# Patient Record
Sex: Male | Born: 1965 | Race: Black or African American | Hispanic: No | Marital: Single | Smoking: Never smoker
Health system: Southern US, Community
[De-identification: ages and names within clinical notes are randomized; demographics above are authoritative.]

## PROBLEM LIST (undated history)

## (undated) DIAGNOSIS — I10 Essential (primary) hypertension: Secondary | ICD-10-CM

## (undated) DIAGNOSIS — K759 Inflammatory liver disease, unspecified: Secondary | ICD-10-CM

## (undated) DIAGNOSIS — K219 Gastro-esophageal reflux disease without esophagitis: Secondary | ICD-10-CM

## (undated) DIAGNOSIS — T148XXA Other injury of unspecified body region, initial encounter: Secondary | ICD-10-CM

## (undated) DIAGNOSIS — M1990 Unspecified osteoarthritis, unspecified site: Secondary | ICD-10-CM

## (undated) DIAGNOSIS — G629 Polyneuropathy, unspecified: Secondary | ICD-10-CM

## (undated) DIAGNOSIS — F329 Major depressive disorder, single episode, unspecified: Secondary | ICD-10-CM

## (undated) DIAGNOSIS — F419 Anxiety disorder, unspecified: Secondary | ICD-10-CM

## (undated) DIAGNOSIS — F32A Depression, unspecified: Secondary | ICD-10-CM

## (undated) DIAGNOSIS — E785 Hyperlipidemia, unspecified: Secondary | ICD-10-CM

## (undated) HISTORY — DX: Other injury of unspecified body region, initial encounter: T14.8XXA

## (undated) HISTORY — PX: SHOULDER ARTHROSCOPY: SHX128

## (undated) HISTORY — PX: ABDOMINAL SURGERY: SHX537

## (undated) HISTORY — DX: Hyperlipidemia, unspecified: E78.5

## (undated) HISTORY — PX: TOE REPLANTATION: SHX1072

## (undated) HISTORY — PX: JOINT REPLACEMENT: SHX530

## (undated) HISTORY — DX: Essential (primary) hypertension: I10

## (undated) HISTORY — PX: APPENDECTOMY: SHX54

---

## 1997-10-01 ENCOUNTER — Emergency Department (HOSPITAL_COMMUNITY): Admission: EM | Admit: 1997-10-01 | Discharge: 1997-10-01 | Payer: Self-pay | Admitting: Emergency Medicine

## 2005-09-14 ENCOUNTER — Emergency Department (HOSPITAL_COMMUNITY): Admission: EM | Admit: 2005-09-14 | Discharge: 2005-09-14 | Payer: Self-pay | Admitting: Emergency Medicine

## 2006-05-02 ENCOUNTER — Emergency Department (HOSPITAL_COMMUNITY): Admission: EM | Admit: 2006-05-02 | Discharge: 2006-05-02 | Payer: Self-pay | Admitting: Emergency Medicine

## 2008-07-24 ENCOUNTER — Inpatient Hospital Stay: Payer: Self-pay | Admitting: Psychiatry

## 2011-07-24 DIAGNOSIS — T148XXA Other injury of unspecified body region, initial encounter: Secondary | ICD-10-CM

## 2011-07-24 HISTORY — DX: Other injury of unspecified body region, initial encounter: T14.8XXA

## 2011-08-02 ENCOUNTER — Emergency Department (HOSPITAL_COMMUNITY): Payer: Medicare Other

## 2011-08-02 ENCOUNTER — Observation Stay (HOSPITAL_COMMUNITY)
Admission: EM | Admit: 2011-08-02 | Discharge: 2011-08-03 | Disposition: A | Discharge status: Home / Self Care | Payer: Medicare Other | Source: Ambulatory Visit | Attending: General Surgery | Admitting: General Surgery

## 2011-08-02 DIAGNOSIS — S21219A Laceration without foreign body of unspecified back wall of thorax without penetration into thoracic cavity, initial encounter: Secondary | ICD-10-CM | POA: Diagnosis present

## 2011-08-02 DIAGNOSIS — Y998 Other external cause status: Secondary | ICD-10-CM | POA: Insufficient documentation

## 2011-08-02 DIAGNOSIS — S20219A Contusion of unspecified front wall of thorax, initial encounter: Secondary | ICD-10-CM

## 2011-08-02 DIAGNOSIS — E785 Hyperlipidemia, unspecified: Secondary | ICD-10-CM | POA: Insufficient documentation

## 2011-08-02 DIAGNOSIS — D62 Acute posthemorrhagic anemia: Secondary | ICD-10-CM | POA: Insufficient documentation

## 2011-08-02 DIAGNOSIS — I10 Essential (primary) hypertension: Secondary | ICD-10-CM | POA: Insufficient documentation

## 2011-08-02 DIAGNOSIS — S21119A Laceration without foreign body of unspecified front wall of thorax without penetration into thoracic cavity, initial encounter: Secondary | ICD-10-CM

## 2011-08-02 DIAGNOSIS — S41009A Unspecified open wound of unspecified shoulder, initial encounter: Principal | ICD-10-CM | POA: Insufficient documentation

## 2011-08-02 DIAGNOSIS — E119 Type 2 diabetes mellitus without complications: Secondary | ICD-10-CM | POA: Insufficient documentation

## 2011-08-02 LAB — POCT I-STAT, CHEM 8
Chloride: 111 mEq/L (ref 96–112)
Creatinine, Ser: 1.5 mg/dL — ABNORMAL HIGH (ref 0.50–1.35)
Glucose, Bld: 94 mg/dL (ref 70–99)
Potassium: 3.6 mEq/L (ref 3.5–5.1)

## 2011-08-02 MED ORDER — HYDROMORPHONE HCL PF 1 MG/ML IJ SOLN
1.0000 mg | INTRAMUSCULAR | Status: DC | PRN
  Administered 2011-08-02: 2 mg via INTRAVENOUS
  Filled 2011-08-02: qty 2

## 2011-08-02 MED ORDER — DOCUSATE SODIUM 100 MG PO CAPS
100.0000 mg | ORAL_CAPSULE | Freq: Two times a day (BID) | ORAL | Status: DC
  Administered 2011-08-02: 100 mg via ORAL
  Filled 2011-08-02: qty 1

## 2011-08-02 MED ORDER — IOHEXOL 350 MG/ML SOLN
100.0000 mL | Freq: Once | INTRAVENOUS | Status: AC | PRN
  Administered 2011-08-02: 100 mL via INTRAVENOUS

## 2011-08-02 MED ORDER — HYDROMORPHONE HCL PF 1 MG/ML IJ SOLN
1.0000 mg | Freq: Once | INTRAMUSCULAR | Status: AC
  Administered 2011-08-02: 1 mg via INTRAVENOUS

## 2011-08-02 MED ORDER — TETANUS-DIPHTH-ACELL PERTUSSIS 5-2.5-18.5 LF-MCG/0.5 IM SUSP
0.5000 mL | Freq: Once | INTRAMUSCULAR | Status: AC
  Administered 2011-08-02: 0.5 mL via INTRAMUSCULAR
  Filled 2011-08-02: qty 0.5

## 2011-08-02 MED ORDER — HYDROCODONE-ACETAMINOPHEN 5-325 MG PO TABS
2.0000 | ORAL_TABLET | ORAL | Status: DC | PRN
  Administered 2011-08-02 – 2011-08-03 (×2): 2 via ORAL
  Filled 2011-08-02 (×2): qty 2

## 2011-08-02 MED ORDER — CEFAZOLIN SODIUM-DEXTROSE 2-3 GM-% IV SOLR
2.0000 g | Freq: Once | INTRAVENOUS | Status: AC
  Administered 2011-08-02: 2 g via INTRAVENOUS

## 2011-08-02 MED ORDER — FENTANYL CITRATE 0.05 MG/ML IJ SOLN: 50.0000 ug | Freq: Once | INTRAMUSCULAR | Status: DC

## 2011-08-02 MED ORDER — CEFAZOLIN SODIUM-DEXTROSE 2-3 GM-% IV SOLR
INTRAVENOUS | Status: AC
  Filled 2011-08-02: qty 50

## 2011-08-02 MED ORDER — HYDROMORPHONE HCL PF 2 MG/ML IJ SOLN
INTRAMUSCULAR | Status: AC
  Administered 2011-08-02: 1 mg
  Filled 2011-08-02: qty 1

## 2011-08-02 MED ORDER — PANTOPRAZOLE SODIUM 40 MG IV SOLR
40.0000 mg | Freq: Every day | INTRAVENOUS | Status: DC
  Filled 2011-08-02: qty 40

## 2011-08-02 MED ORDER — ONDANSETRON HCL 4 MG/2ML IJ SOLN: 4.0000 mg | Freq: Four times a day (QID) | INTRAMUSCULAR | Status: DC | PRN

## 2011-08-02 MED ORDER — CEFAZOLIN SODIUM 1-5 GM-% IV SOLN
1.0000 g | Freq: Three times a day (TID) | INTRAVENOUS | Status: DC
  Administered 2011-08-03: 1 g via INTRAVENOUS
  Filled 2011-08-02 (×3): qty 50

## 2011-08-02 MED ORDER — ONDANSETRON HCL 4 MG PO TABS: 4.0000 mg | ORAL_TABLET | Freq: Four times a day (QID) | ORAL | Status: DC | PRN

## 2011-08-02 MED ORDER — KCL IN DEXTROSE-NACL 20-5-0.45 MEQ/L-%-% IV SOLN
INTRAVENOUS | Status: DC
  Administered 2011-08-02: 23:00:00 via INTRAVENOUS
  Filled 2011-08-02 (×4): qty 1000

## 2011-08-02 MED ORDER — FENTANYL CITRATE 0.05 MG/ML IJ SOLN
INTRAMUSCULAR | Status: AC
  Administered 2011-08-02: 50 ug
  Filled 2011-08-02: qty 2

## 2011-08-02 MED ORDER — MIDAZOLAM HCL 2 MG/2ML IJ SOLN
INTRAMUSCULAR | Status: AC
  Filled 2011-08-02: qty 2

## 2011-08-02 MED ORDER — PANTOPRAZOLE SODIUM 40 MG PO TBEC
40.0000 mg | DELAYED_RELEASE_TABLET | Freq: Every day | ORAL | Status: DC
  Administered 2011-08-02: 40 mg via ORAL
  Filled 2011-08-02: qty 1

## 2011-08-02 MED ORDER — HYDROMORPHONE HCL PF 1 MG/ML IJ SOLN
INTRAMUSCULAR | Status: AC
  Filled 2011-08-02: qty 1

## 2011-08-02 MED ORDER — MIDAZOLAM HCL 2 MG/2ML IJ SOLN
2.0000 mg | Freq: Once | INTRAMUSCULAR | Status: AC
  Administered 2011-08-02: 2 mg via INTRAVENOUS
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
## 2011-08-02 NOTE — ED Notes (Signed)
Dr. Lindie Spruce at bedside suturing pt.
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
## 2011-08-02 NOTE — ED Notes (Signed)
Pts Password: [phone]
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
## 2011-08-02 NOTE — ED Notes (Signed)
Staples placed by Dr.Wyatt. Dressing dry/intact. [employer] at bedside speaking to pt.
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
## 2011-08-02 NOTE — ED Notes (Signed)
To CT with RN and MD at bedside.

## 2011-08-02 NOTE — ED Notes (Signed)
Lab test results handed to Lindie Spruce, MD (Trauma)

## 2011-08-03 ENCOUNTER — Observation Stay (HOSPITAL_COMMUNITY): Payer: Medicare Other

## 2011-08-03 DIAGNOSIS — S21219A Laceration without foreign body of unspecified back wall of thorax without penetration into thoracic cavity, initial encounter: Secondary | ICD-10-CM | POA: Diagnosis present

## 2011-08-03 DIAGNOSIS — D62 Acute posthemorrhagic anemia: Secondary | ICD-10-CM | POA: Diagnosis not present

## 2011-08-03 DIAGNOSIS — I10 Essential (primary) hypertension: Secondary | ICD-10-CM | POA: Insufficient documentation

## 2011-08-03 DIAGNOSIS — E119 Type 2 diabetes mellitus without complications: Secondary | ICD-10-CM | POA: Insufficient documentation

## 2011-08-03 DIAGNOSIS — E785 Hyperlipidemia, unspecified: Secondary | ICD-10-CM | POA: Insufficient documentation

## 2011-08-03 DIAGNOSIS — S20219A Contusion of unspecified front wall of thorax, initial encounter: Secondary | ICD-10-CM | POA: Diagnosis present

## 2011-08-03 LAB — BASIC METABOLIC PANEL
CO2: 21 mEq/L (ref 19–32)
GFR calc non Af Amer: 82 mL/min — ABNORMAL LOW (ref >90)
Glucose, Bld: 108 mg/dL — ABNORMAL HIGH (ref 70–99)
Potassium: 3.7 mEq/L (ref 3.5–5.1)
Sodium: 141 mEq/L (ref 135–145)

## 2011-08-03 LAB — CBC
HCT: 36.4 % — ABNORMAL LOW (ref 39.0–52.0)
Hemoglobin: 12.7 g/dL — ABNORMAL LOW (ref 13.0–17.0)
Hemoglobin: 12.6 g/dL — ABNORMAL LOW (ref 13.0–17.0)
MCH: 30.1 pg (ref 26.0–34.0)
RBC: 4.19 MIL/uL — ABNORMAL LOW (ref 4.22–5.81)
RDW: 15.7 % — ABNORMAL HIGH (ref 11.5–15.5)
WBC: 11.7 10*3/uL — ABNORMAL HIGH (ref 4.0–10.5)

## 2011-08-03 LAB — GLUCOSE, CAPILLARY: Glucose-Capillary: 99 mg/dL (ref 70–99)

## 2011-08-03 LAB — PROTIME-INR: Prothrombin Time: 14.3 seconds (ref 11.6–15.2)

## 2011-08-03 MED ORDER — HYDROCODONE-ACETAMINOPHEN 5-325 MG PO TABS: 2.0000 | ORAL_TABLET | ORAL | Status: AC | PRN

## 2011-08-03 NOTE — Progress Notes (Signed)
Pt to be discharged home per MD order. VSS. No signs of bleeding or infection at wound site. Discharge instructions given and pt verbalized understand and signed the forms. Copy of discharge instructions given and script for pain meds. IV's removed.

## 2011-08-03 NOTE — Progress Notes (Signed)
UR complete 
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
## 2011-08-03 NOTE — Progress Notes (Signed)
Agree Eain Mullendore, MD, MPH, FACS Pager: [phone]  
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
## 2011-08-03 NOTE — Discharge Summary (Signed)
Kevin Zufall, MD, MPH, FACS Pager: [phone]  

## 2011-08-17 ENCOUNTER — Ambulatory Visit (INDEPENDENT_AMBULATORY_CARE_PROVIDER_SITE_OTHER): Payer: Medicare Other | Admitting: Internal Medicine

## 2011-08-17 ENCOUNTER — Encounter (INDEPENDENT_AMBULATORY_CARE_PROVIDER_SITE_OTHER): Payer: Self-pay

## 2011-08-17 VITALS — BP 170/116 | HR 72 | Temp 98.6°F | Resp 18 | Ht 70.0 in | Wt 227.4 lb

## 2011-08-17 DIAGNOSIS — S21209A Unspecified open wound of unspecified back wall of thorax without penetration into thoracic cavity, initial encounter: Secondary | ICD-10-CM

## 2011-08-17 DIAGNOSIS — S21219A Laceration without foreign body of unspecified back wall of thorax without penetration into thoracic cavity, initial encounter: Secondary | ICD-10-CM

## 2011-08-17 NOTE — Patient Instructions (Signed)
Call with questions or concerns  Follow up as needed
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
## 2012-04-26 ENCOUNTER — Emergency Department (HOSPITAL_COMMUNITY): Payer: PRIVATE HEALTH INSURANCE

## 2012-04-26 ENCOUNTER — Encounter (HOSPITAL_COMMUNITY): Payer: Self-pay

## 2012-04-26 ENCOUNTER — Emergency Department (HOSPITAL_COMMUNITY)
Admission: EM | Admit: 2012-04-26 | Discharge: 2012-04-26 | Disposition: A | Discharge status: Home / Self Care | Payer: PRIVATE HEALTH INSURANCE | Attending: Emergency Medicine | Admitting: Emergency Medicine

## 2012-04-26 DIAGNOSIS — E785 Hyperlipidemia, unspecified: Secondary | ICD-10-CM | POA: Insufficient documentation

## 2012-04-26 DIAGNOSIS — Z79899 Other long term (current) drug therapy: Secondary | ICD-10-CM | POA: Insufficient documentation

## 2012-04-26 DIAGNOSIS — I10 Essential (primary) hypertension: Secondary | ICD-10-CM | POA: Insufficient documentation

## 2012-04-26 DIAGNOSIS — E119 Type 2 diabetes mellitus without complications: Secondary | ICD-10-CM | POA: Insufficient documentation

## 2012-04-26 DIAGNOSIS — M549 Dorsalgia, unspecified: Secondary | ICD-10-CM

## 2012-04-26 DIAGNOSIS — M545 Low back pain, unspecified: Secondary | ICD-10-CM | POA: Insufficient documentation

## 2012-04-26 DIAGNOSIS — Z87828 Personal history of other (healed) physical injury and trauma: Secondary | ICD-10-CM | POA: Insufficient documentation

## 2012-04-26 LAB — URINALYSIS, ROUTINE W REFLEX MICROSCOPIC
Bilirubin Urine: NEGATIVE
Ketones, ur: NEGATIVE mg/dL
Nitrite: NEGATIVE
Protein, ur: NEGATIVE mg/dL
Specific Gravity, Urine: 1.019 (ref 1.005–1.030)
Urobilinogen, UA: 1 mg/dL (ref 0.0–1.0)

## 2012-04-26 MED ORDER — PREDNISONE 20 MG PO TABS
ORAL_TABLET | ORAL | Status: DC
Start: 2012-04-26 — End: 2012-09-19

## 2012-04-26 NOTE — ED Notes (Signed)
Pt c/o left flank pain for a week now. States he had gone to the MD and he had gotten pain pills but they are not helping. States nothing is helping. States his urine is dark in color but does not hurt when he urinates.

## 2012-04-26 NOTE — ED Notes (Signed)
Pt presents with left flank and lower back pain.  Lower back tender to palpate.  Pt denies any difficulty urinating.  Last BM was today.
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
## 2012-04-27 NOTE — ED Provider Notes (Signed)
Medical screening examination/treatment/procedure(s) were performed by non-physician practitioner and as supervising physician I was immediately available for consultation/collaboration.  Flint Melter, MD 04/27/12 [phone]

## 2012-09-19 ENCOUNTER — Encounter (HOSPITAL_COMMUNITY): Payer: Self-pay | Admitting: *Deleted

## 2012-09-19 ENCOUNTER — Emergency Department (HOSPITAL_COMMUNITY)
Admission: EM | Admit: 2012-09-19 | Discharge: 2012-09-19 | Disposition: A | Discharge status: Home / Self Care | Payer: 59 | Attending: Emergency Medicine | Admitting: Emergency Medicine

## 2012-09-19 DIAGNOSIS — Z79899 Other long term (current) drug therapy: Secondary | ICD-10-CM | POA: Insufficient documentation

## 2012-09-19 DIAGNOSIS — I10 Essential (primary) hypertension: Secondary | ICD-10-CM | POA: Insufficient documentation

## 2012-09-19 DIAGNOSIS — G8929 Other chronic pain: Secondary | ICD-10-CM | POA: Insufficient documentation

## 2012-09-19 DIAGNOSIS — E785 Hyperlipidemia, unspecified: Secondary | ICD-10-CM | POA: Insufficient documentation

## 2012-09-19 DIAGNOSIS — Z87828 Personal history of other (healed) physical injury and trauma: Secondary | ICD-10-CM | POA: Insufficient documentation

## 2012-09-19 DIAGNOSIS — M25569 Pain in unspecified knee: Secondary | ICD-10-CM | POA: Insufficient documentation

## 2012-09-19 DIAGNOSIS — E119 Type 2 diabetes mellitus without complications: Secondary | ICD-10-CM | POA: Insufficient documentation

## 2012-09-19 DIAGNOSIS — Z791 Long term (current) use of non-steroidal anti-inflammatories (NSAID): Secondary | ICD-10-CM | POA: Insufficient documentation

## 2012-09-19 MED ORDER — MELOXICAM 7.5 MG PO TABS: 7.5000 mg | ORAL_TABLET | Freq: Every day | ORAL | Status: DC

## 2012-09-19 NOTE — ED Notes (Signed)
Reports chronic left knee pain, is aware that he needs knee surgery. No pain meds at home. Pt ambulatory at triage.
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
## 2012-09-19 NOTE — Progress Notes (Signed)
Orthopedic Tech Progress Note Patient Details:  Kevin Jennings. 27-Jul-1965 [id number]  Ortho Devices Type of Ortho Device: Knee Sleeve Ortho Device/Splint Interventions: Application   Cammer, Mickie Bail 09/19/2012, 11:10 AM

## 2012-09-23 NOTE — ED Provider Notes (Signed)
Medical screening examination/treatment/procedure(s) were performed by non-physician practitioner and as supervising physician I was immediately available for consultation/collaboration.  Kathrynn Backstrom T Osa Fogarty, MD 09/23/12 0824 

## 2013-02-26 ENCOUNTER — Other Ambulatory Visit (HOSPITAL_COMMUNITY): Payer: Self-pay

## 2013-02-26 ENCOUNTER — Other Ambulatory Visit (HOSPITAL_COMMUNITY): Payer: Self-pay | Admitting: Orthopaedic Surgery

## 2013-03-04 ENCOUNTER — Encounter (HOSPITAL_COMMUNITY): Payer: Self-pay | Admitting: Pharmacy Technician

## 2013-03-06 ENCOUNTER — Other Ambulatory Visit (HOSPITAL_COMMUNITY): Payer: Self-pay | Admitting: Orthopaedic Surgery

## 2013-03-06 NOTE — Patient Instructions (Signed)
      Your procedure is scheduled on:  03/12/13  [facility]  [facility] at    0630   AM.  Call this number if you have problems the morning of surgery: [phone]        Do not eat food  Or drink :After Midnight.TUESDAY NIGHT   Take these medicines the morning of surgery with A SIP OF WATER: Amlodipine,        Flonase nasal spray DO NOT TAKE ANY DIABETES MEDICATION MORNING OF SURGERY  .  Contacts, dentures or partial plates, or metal hairpins  can not be worn to surgery. Your family will be responsible for glasses, dentures, hearing aides while you are in surgery  Leave suitcase in the car. After surgery it may be brought to your room.  For patients admitted to the hospital, checkout time is 11:00 AM day of  discharge.                DO NOT WEAR JEWELRY, LOTIONS, POWDERS, OR PERFUMES.  WOMEN-- DO NOT SHAVE LEGS OR UNDERARMS FOR 48 HOURS BEFORE SHOWERS. MEN MAY SHAVE FACE.  Patients discharged the day of surgery will not be allowed to drive home. IF going home the day of surgery, you must have a driver and someone to stay with you for the first 24 hours  Name and phone number of your driver:      admission                                                                  Please read over the following fact sheets that you were given: MRSA Information, Blood Transfusion Sheet  Information                     FAILURE TO FOLLOW THESE INSTRUCTIONS MAY RESULT IN  CANCELLATION   OF YOUR SURGERY                                                  Patient Signature _____________________________

## 2013-03-07 ENCOUNTER — Ambulatory Visit (HOSPITAL_COMMUNITY)
Admission: RE | Admit: 2013-03-07 | Discharge: 2013-03-07 | Disposition: A | Discharge status: Home / Self Care | Payer: PRIVATE HEALTH INSURANCE | Source: Ambulatory Visit | Attending: Orthopaedic Surgery | Admitting: Orthopaedic Surgery

## 2013-03-07 ENCOUNTER — Encounter (HOSPITAL_COMMUNITY): Payer: Self-pay

## 2013-03-07 ENCOUNTER — Encounter (HOSPITAL_COMMUNITY)
Admission: RE | Admit: 2013-03-07 | Discharge: 2013-03-07 | Disposition: A | Discharge status: Home / Self Care | Payer: PRIVATE HEALTH INSURANCE | Source: Ambulatory Visit | Attending: Orthopaedic Surgery | Admitting: Orthopaedic Surgery

## 2013-03-07 DIAGNOSIS — Z01812 Encounter for preprocedural laboratory examination: Secondary | ICD-10-CM | POA: Diagnosis present

## 2013-03-07 DIAGNOSIS — Z01818 Encounter for other preprocedural examination: Secondary | ICD-10-CM | POA: Insufficient documentation

## 2013-03-07 HISTORY — DX: Unspecified osteoarthritis, unspecified site: M19.90

## 2013-03-07 HISTORY — DX: Polyneuropathy, unspecified: G62.9

## 2013-03-07 LAB — URINALYSIS, ROUTINE W REFLEX MICROSCOPIC
Bilirubin Urine: NEGATIVE
Glucose, UA: NEGATIVE mg/dL
Hgb urine dipstick: NEGATIVE
KETONES UR: NEGATIVE mg/dL
LEUKOCYTES UA: NEGATIVE
NITRITE: NEGATIVE
PH: 6.5 (ref 5.0–8.0)
Protein, ur: NEGATIVE mg/dL
Specific Gravity, Urine: 1.015 (ref 1.005–1.030)
Urobilinogen, UA: 0.2 mg/dL (ref 0.0–1.0)

## 2013-03-07 LAB — CBC WITH DIFFERENTIAL/PLATELET
Basophils Absolute: 0 10*3/uL (ref 0.0–0.1)
Basophils Relative: 0 % (ref 0–1)
EOS PCT: 1 % (ref 0–5)
Eosinophils Absolute: 0.1 10*3/uL (ref 0.0–0.7)
HCT: 43 % (ref 39.0–52.0)
Hemoglobin: 15 g/dL (ref 13.0–17.0)
LYMPHS PCT: 28 % (ref 12–46)
Lymphs Abs: 2.2 10*3/uL (ref 0.7–4.0)
MCH: 30.7 pg (ref 26.0–34.0)
MCHC: 34.9 g/dL (ref 30.0–36.0)
MCV: 87.9 fL (ref 78.0–100.0)
MONO ABS: 0.8 10*3/uL (ref 0.1–1.0)
Monocytes Relative: 10 % (ref 3–12)
Neutro Abs: 4.8 10*3/uL (ref 1.7–7.7)
Neutrophils Relative %: 61 % (ref 43–77)
PLATELETS: 240 10*3/uL (ref 150–400)
RBC: 4.89 MIL/uL (ref 4.22–5.81)
RDW: 14.9 % (ref 11.5–15.5)
WBC: 7.8 10*3/uL (ref 4.0–10.5)

## 2013-03-07 LAB — COMPREHENSIVE METABOLIC PANEL
ALT: 50 U/L (ref 0–53)
AST: 32 U/L (ref 0–37)
Albumin: 4.6 g/dL (ref 3.5–5.2)
Alkaline Phosphatase: 63 U/L (ref 39–117)
BUN: 11 mg/dL (ref 6–23)
CALCIUM: 10.4 mg/dL (ref 8.4–10.5)
CO2: 27 meq/L (ref 19–32)
Chloride: 100 mEq/L (ref 96–112)
Creatinine, Ser: 1.22 mg/dL (ref 0.50–1.35)
GFR calc Af Amer: 80 mL/min — ABNORMAL LOW (ref >90)
GFR calc non Af Amer: 69 mL/min — ABNORMAL LOW (ref >90)
Glucose, Bld: 94 mg/dL (ref 70–99)
Potassium: 3.4 mEq/L — ABNORMAL LOW (ref 3.7–5.3)
SODIUM: 142 meq/L (ref 137–147)
Total Bilirubin: 1.1 mg/dL (ref 0.3–1.2)
Total Protein: 8.4 g/dL — ABNORMAL HIGH (ref 6.0–8.3)

## 2013-03-07 LAB — PREALBUMIN: PREALBUMIN: 40.4 mg/dL — AB (ref 17.0–34.0)

## 2013-03-07 LAB — PROTIME-INR
INR: 0.97 (ref 0.00–1.49)
Prothrombin Time: 12.7 seconds (ref 11.6–15.2)

## 2013-03-07 LAB — ABO/RH: ABO/RH(D): O POS

## 2013-03-07 LAB — SURGICAL PCR SCREEN
MRSA, PCR: NEGATIVE
Staphylococcus aureus: NEGATIVE

## 2013-03-07 LAB — C-REACTIVE PROTEIN: CRP: <0.5 mg/dL — ABNORMAL LOW (ref <0.60)

## 2013-03-07 LAB — APTT: APTT: 30 s (ref 24–37)

## 2013-03-07 LAB — SEDIMENTATION RATE: SED RATE: 11 mm/h (ref 0–16)

## 2013-03-07 NOTE — Progress Notes (Signed)
Clearance Dr Beckie BusingHao Liu on chart.  At PST visit-  i discussed with patient that anesthesia and street drugs do not mix. Informed he could have a bad outcome.  Informed him we will be doing urine drug test AM of OR.  Patient states will not use any more drugs before surgery

## 2013-03-07 NOTE — Progress Notes (Signed)
C Reactive Protein and Prealbumin faxed to Dr Roda Shut[facility]Xu mailbox [facility]

## 2013-03-08 LAB — URINE CULTURE
CULTURE: NO GROWTH
Colony Count: NO GROWTH

## 2013-03-12 ENCOUNTER — Ambulatory Visit (HOSPITAL_COMMUNITY)
Admission: RE | Admit: 2013-03-12 | Discharge: 2013-03-12 | Disposition: A | Discharge status: Home / Self Care | Payer: PRIVATE HEALTH INSURANCE | Source: Ambulatory Visit | Attending: Orthopaedic Surgery | Admitting: Orthopaedic Surgery

## 2013-03-12 ENCOUNTER — Inpatient Hospital Stay (HOSPITAL_COMMUNITY): Payer: PRIVATE HEALTH INSURANCE

## 2013-03-12 ENCOUNTER — Inpatient Hospital Stay (HOSPITAL_COMMUNITY): Payer: PRIVATE HEALTH INSURANCE | Admitting: Anesthesiology

## 2013-03-12 ENCOUNTER — Encounter (HOSPITAL_COMMUNITY): Payer: Self-pay | Admitting: Anesthesiology

## 2013-03-12 ENCOUNTER — Encounter (HOSPITAL_COMMUNITY): Payer: PRIVATE HEALTH INSURANCE | Admitting: Anesthesiology

## 2013-03-12 DIAGNOSIS — Z79899 Other long term (current) drug therapy: Secondary | ICD-10-CM | POA: Insufficient documentation

## 2013-03-12 DIAGNOSIS — M1612 Unilateral primary osteoarthritis, left hip: Secondary | ICD-10-CM | POA: Diagnosis present

## 2013-03-12 DIAGNOSIS — I10 Essential (primary) hypertension: Secondary | ICD-10-CM | POA: Insufficient documentation

## 2013-03-12 DIAGNOSIS — E785 Hyperlipidemia, unspecified: Secondary | ICD-10-CM | POA: Insufficient documentation

## 2013-03-12 DIAGNOSIS — R82998 Other abnormal findings in urine: Secondary | ICD-10-CM | POA: Insufficient documentation

## 2013-03-12 DIAGNOSIS — E119 Type 2 diabetes mellitus without complications: Secondary | ICD-10-CM | POA: Insufficient documentation

## 2013-03-12 DIAGNOSIS — G609 Hereditary and idiopathic neuropathy, unspecified: Secondary | ICD-10-CM | POA: Insufficient documentation

## 2013-03-12 DIAGNOSIS — M1610 Unilateral primary osteoarthritis, unspecified hip: Secondary | ICD-10-CM | POA: Insufficient documentation

## 2013-03-12 DIAGNOSIS — Z5309 Procedure and treatment not carried out because of other contraindication: Secondary | ICD-10-CM | POA: Insufficient documentation

## 2013-03-12 DIAGNOSIS — M169 Osteoarthritis of hip, unspecified: Secondary | ICD-10-CM | POA: Insufficient documentation

## 2013-03-12 LAB — GLUCOSE, CAPILLARY: GLUCOSE-CAPILLARY: 102 mg/dL — AB (ref 70–99)

## 2013-03-12 LAB — TYPE AND SCREEN
ABO/RH(D): O POS
Antibody Screen: NEGATIVE

## 2013-03-12 LAB — RAPID URINE DRUG SCREEN, HOSP PERFORMED
AMPHETAMINES: NOT DETECTED
BARBITURATES: NOT DETECTED
Benzodiazepines: NOT DETECTED
Cocaine: POSITIVE — AB
Opiates: NOT DETECTED
TETRAHYDROCANNABINOL: NOT DETECTED

## 2013-03-12 MED ORDER — TRANEXAMIC ACID 100 MG/ML IV SOLN
1000.0000 mg | INTRAVENOUS | Status: DC
  Filled 2013-03-12: qty 10

## 2013-03-12 MED ORDER — SODIUM CHLORIDE 0.9 % IJ SOLN
INTRAMUSCULAR | Status: AC
Start: 2013-03-12 — End: 2013-03-12
  Filled 2013-03-12: qty 10

## 2013-03-12 MED ORDER — LIDOCAINE HCL (CARDIAC) 20 MG/ML IV SOLN
INTRAVENOUS | Status: AC
  Filled 2013-03-12: qty 5

## 2013-03-12 MED ORDER — CHLORHEXIDINE GLUCONATE 4 % EX LIQD: 60.0000 mL | Freq: Once | CUTANEOUS | Status: DC

## 2013-03-12 MED ORDER — 0.9 % SODIUM CHLORIDE (POUR BTL) OPTIME
TOPICAL | Status: DC | PRN
  Administered 2013-03-12: 1000 mL

## 2013-03-12 MED ORDER — EPHEDRINE SULFATE 50 MG/ML IJ SOLN
INTRAMUSCULAR | Status: AC
  Filled 2013-03-12: qty 1

## 2013-03-12 MED ORDER — CEFAZOLIN SODIUM-DEXTROSE 2-3 GM-% IV SOLR: 2.0000 g | INTRAVENOUS | Status: DC

## 2013-03-12 MED ORDER — ROCURONIUM BROMIDE 100 MG/10ML IV SOLN
INTRAVENOUS | Status: AC
  Filled 2013-03-12: qty 1

## 2013-03-12 MED ORDER — FENTANYL CITRATE 0.05 MG/ML IJ SOLN
INTRAMUSCULAR | Status: AC
Start: 2013-03-12 — End: 2013-03-12
  Filled 2013-03-12: qty 5

## 2013-03-12 MED ORDER — PROPOFOL 10 MG/ML IV BOLUS
INTRAVENOUS | Status: AC
  Filled 2013-03-12: qty 20

## 2013-03-12 MED ORDER — LACTATED RINGERS IV SOLN
INTRAVENOUS | Status: AC | PRN
  Administered 2013-03-12 – 2014-02-18 (×2): via INTRAVENOUS

## 2013-03-12 MED ORDER — MIDAZOLAM HCL 2 MG/2ML IJ SOLN
INTRAMUSCULAR | Status: AC
  Filled 2013-03-12: qty 2

## 2013-03-12 MED ORDER — ONDANSETRON HCL 4 MG/2ML IJ SOLN
INTRAMUSCULAR | Status: AC
  Filled 2013-03-12: qty 2

## 2013-03-12 MED ORDER — SODIUM CHLORIDE 0.9 % IR SOLN
Status: DC | PRN
  Administered 2013-03-12: 1000 mL
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
## 2013-03-12 NOTE — Anesthesia Preprocedure Evaluation (Addendum)
Anesthesia Evaluation  Patient identified by MRN, date of birth, ID band Patient awake    Reviewed: Allergy & Precautions, H&P , NPO status , Patient's Chart, lab work & pertinent test results  Airway Mallampati: II TM Distance: >3 FB Neck ROM: Full    Dental no notable dental hx.    Pulmonary neg pulmonary ROS,  breath sounds clear to auscultation  Pulmonary exam normal       Cardiovascular Exercise Tolerance: Good hypertension, Pt. on medications negative cardio ROS  Rhythm:Regular Rate:Normal     Neuro/Psych Peripheral neuropathy.  Neuromuscular disease negative psych ROS   GI/Hepatic negative GI ROS, (+)     substance abuse  cocaine use,   Endo/Other  diabetes, Type 2, Oral Hypoglycemic Agents  Renal/GU negative Renal ROS  negative genitourinary   Musculoskeletal negative musculoskeletal ROS (+)   Abdominal (+) + obese,   Peds negative pediatric ROS (+)  Hematology  (+) anemia ,   Anesthesia Other Findings   Reproductive/Obstetrics negative OB ROS                       Anesthesia Physical Anesthesia Plan  ASA: III  Anesthesia Plan: General   Post-op Pain Management:    Induction: Intravenous  Airway Management Planned: Oral ETT  Additional Equipment:   Intra-op Plan:   Post-operative Plan: Extubation in OR  Informed Consent: I have reviewed the patients History and Physical, chart, labs and discussed the procedure including the risks, benefits and alternatives for the proposed anesthesia with the patient or authorized representative who has indicated his/her understanding and acceptance.   Dental advisory given  Plan Discussed with: CRNA  Anesthesia Plan Comments: (Discussed r/b general versus spinal. He prefers general. Urine positive for cocaine use. Discussed with patient and D.r Roda ShuttersXu. Although he does not appear acutely intoxicated, he is at increased risk for cardiac  dysrhythmias from chronic use. The plan is to cancel and reschedule after a negative urine screen. Dr. Roda ShuttersXu understands that patient will always be at somewhat increased risk from chronic cocaine use. "Cocaine use can cause myocardial ischemia and hypotension that lasts as long as 6 weeks after discontinuing cocaine use." (from Anesthesia and Co-existing Disease.))      Anesthesia Quick Evaluation

## 2013-03-12 NOTE — Progress Notes (Signed)
Patient back from holding. OK to dress and discharge from room to home with friend Everlean AlstromMaurice.

## 2013-03-12 NOTE — Progress Notes (Addendum)
Patient tested positive for cocaine on recent UDS.  He is a known past addict.  Discussed this finding with the anesthesiologist and patient at bedside.  We will postpone the surgery until the cocaine is out of his system so that his periop cardiac risk is back to baseline.  Patient is slightly upset but understands.  We will reschedule at his convenience.  Will need UDS the morning of surgery.  Mayra ReelN. Michael Xu, MD [facility] [phone] 9:35 AM

## 2013-03-13 ENCOUNTER — Other Ambulatory Visit (HOSPITAL_COMMUNITY): Payer: Self-pay | Admitting: Orthopaedic Surgery

## 2013-03-18 ENCOUNTER — Encounter (HOSPITAL_COMMUNITY): Payer: Self-pay | Admitting: *Deleted

## 2013-03-18 NOTE — Progress Notes (Addendum)
Multiple messages left for patient to call us back to receive phone preop instructions for surgery on 03/19/12.  Latest message left on 03/17/13.  Office made aware on 03/18/13 (SPOKE WITH SURGERY SCHEDULER, SHERRY ) and made her aware that patient has not returned our calls to him. Short Stay also aware that patient has not returned our calls so that we can give patient preop instructions.  This note was created by Cyndia DiverKarla Janette Harvie RN not by Jolyn Naponie Bryant. RN.

## 2013-03-18 NOTE — Progress Notes (Addendum)
Able to get in touch with patient with new phone number [facility] [place] [facility].  Revewed medications , history and instructions with patient.  Patient verbalizes instructions .  This note was created by Cyndia DiverKarla Moneka Mcquinn RN not by Jolyn Naponie Bryant RN .

## 2013-03-19 ENCOUNTER — Encounter (HOSPITAL_COMMUNITY): Payer: Self-pay | Admitting: Anesthesiology

## 2013-03-19 ENCOUNTER — Encounter (HOSPITAL_COMMUNITY): Payer: Self-pay

## 2013-03-19 ENCOUNTER — Ambulatory Visit (HOSPITAL_COMMUNITY)
Admission: RE | Admit: 2013-03-19 | Discharge: 2013-03-19 | Disposition: A | Discharge status: Home / Self Care | Payer: PRIVATE HEALTH INSURANCE | Source: Ambulatory Visit | Attending: Orthopaedic Surgery | Admitting: Orthopaedic Surgery

## 2013-03-19 ENCOUNTER — Encounter (HOSPITAL_COMMUNITY)
Admission: RE | Disposition: A | Discharge status: Home / Self Care | Payer: Self-pay | Source: Ambulatory Visit | Attending: Orthopaedic Surgery

## 2013-03-19 ENCOUNTER — Encounter (HOSPITAL_COMMUNITY): Payer: Self-pay | Admitting: *Deleted

## 2013-03-19 ENCOUNTER — Inpatient Hospital Stay (HOSPITAL_COMMUNITY): Admit: 2013-03-19 | Payer: Medicare Other | Admitting: Orthopaedic Surgery

## 2013-03-19 DIAGNOSIS — M1610 Unilateral primary osteoarthritis, unspecified hip: Secondary | ICD-10-CM | POA: Insufficient documentation

## 2013-03-19 DIAGNOSIS — M169 Osteoarthritis of hip, unspecified: Secondary | ICD-10-CM | POA: Insufficient documentation

## 2013-03-19 DIAGNOSIS — Z5309 Procedure and treatment not carried out because of other contraindication: Secondary | ICD-10-CM | POA: Insufficient documentation

## 2013-03-19 DIAGNOSIS — Z01812 Encounter for preprocedural laboratory examination: Secondary | ICD-10-CM | POA: Insufficient documentation

## 2013-03-19 LAB — RAPID URINE DRUG SCREEN, HOSP PERFORMED
Amphetamines: NOT DETECTED
Barbiturates: NOT DETECTED
Benzodiazepines: NOT DETECTED
COCAINE: POSITIVE — AB
OPIATES: NOT DETECTED
Tetrahydrocannabinol: NOT DETECTED

## 2013-03-19 LAB — COMPREHENSIVE METABOLIC PANEL
ALK PHOS: 59 U/L (ref 39–117)
ALT: 26 U/L (ref 0–53)
AST: 22 U/L (ref 0–37)
Albumin: 4.6 g/dL (ref 3.5–5.2)
BUN: 8 mg/dL (ref 6–23)
CALCIUM: 10.5 mg/dL (ref 8.4–10.5)
CO2: 21 mEq/L (ref 19–32)
CREATININE: 1.32 mg/dL (ref 0.50–1.35)
Chloride: 103 mEq/L (ref 96–112)
GFR calc non Af Amer: 63 mL/min — ABNORMAL LOW (ref >90)
GFR, EST AFRICAN AMERICAN: 73 mL/min — AB (ref >90)
GLUCOSE: 90 mg/dL (ref 70–99)
POTASSIUM: 4.5 meq/L (ref 3.7–5.3)
Sodium: 139 mEq/L (ref 137–147)
TOTAL PROTEIN: 8.7 g/dL — AB (ref 6.0–8.3)
Total Bilirubin: 0.9 mg/dL (ref 0.3–1.2)

## 2013-03-19 LAB — TYPE AND SCREEN
ABO/RH(D): O POS
ANTIBODY SCREEN: NEGATIVE

## 2013-03-19 LAB — GLUCOSE, CAPILLARY: GLUCOSE-CAPILLARY: 84 mg/dL (ref 70–99)

## 2013-03-19 SURGERY — ARTHROPLASTY, HIP, TOTAL, ANTERIOR APPROACH
Anesthesia: General

## 2013-03-19 MED ORDER — CEFAZOLIN SODIUM-DEXTROSE 2-3 GM-% IV SOLR: 2.0000 g | INTRAVENOUS | Status: DC

## 2013-03-19 NOTE — H&P (Signed)
H&P update  The surgical history has been reviewed and remains accurate without interval change.  The patient was re-examined and patient's physiologic condition has not changed significantly in the last 30 days. The condition still exists that makes this procedure necessary. The treatment plan remains the same, without new options for care.  No new pharmacological allergies or types of therapy has been initiated that would change the plan or the appropriateness of the plan.  The patient and/or family understand the potential benefits and risks.  Mayra ReelN. Michael Alesa Echevarria, MD 03/19/2013 10:07 AM

## 2013-03-19 NOTE — Progress Notes (Signed)
Dr. Leta JunglingEwell paged to give results of UDS>

## 2013-03-19 NOTE — Progress Notes (Signed)
Patient admitted using "weed" last night and after denying use of cocaine admitted his last use was Saturday. States he has been drinking vinegar water to flush out his system. This information shared with Dr. Leta JunglingEwell and Dr. Roda ShuttersXu. Dr. Roda ShuttersXu stated he was cancelling surgery for today but would be by to speak with patient.  When Mr. Kevin Jennings asked I informed him surgery was canceled and Dr. Roda ShuttersXu wanted to come by to see him. Patient was angry and stated he was not staying here for the doctor to talk a bunch of junk if he was not going to do his surgery. Asked again that he wait to speak with doctor, but we do not hold people against there will. Family members  arrived to see Mr. Kevin Jennings they were trying to calm him down, and his mother wanted to know why he was not having surgery. Informed her that Mr. Kevin Jennings would have to give her that information.

## 2013-10-22 ENCOUNTER — Other Ambulatory Visit (HOSPITAL_COMMUNITY): Payer: Self-pay | Admitting: Orthopaedic Surgery

## 2013-10-22 DIAGNOSIS — M1610 Unilateral primary osteoarthritis, unspecified hip: Secondary | ICD-10-CM

## 2013-10-22 DIAGNOSIS — M169 Osteoarthritis of hip, unspecified: Secondary | ICD-10-CM

## 2013-10-30 ENCOUNTER — Encounter (HOSPITAL_COMMUNITY): Payer: Self-pay | Admitting: Pharmacy Technician

## 2013-10-30 ENCOUNTER — Encounter (HOSPITAL_COMMUNITY)
Admission: RE | Admit: 2013-10-30 | Discharge: 2013-10-30 | Disposition: A | Discharge status: Home / Self Care | Payer: PRIVATE HEALTH INSURANCE | Source: Ambulatory Visit | Attending: Orthopaedic Surgery | Admitting: Orthopaedic Surgery

## 2013-10-30 ENCOUNTER — Encounter (HOSPITAL_COMMUNITY): Payer: Self-pay

## 2013-10-30 DIAGNOSIS — D62 Acute posthemorrhagic anemia: Secondary | ICD-10-CM | POA: Diagnosis not present

## 2013-10-30 DIAGNOSIS — Z0000 Encounter for general adult medical examination without abnormal findings: Secondary | ICD-10-CM | POA: Diagnosis not present

## 2013-10-30 DIAGNOSIS — M1612 Unilateral primary osteoarthritis, left hip: Secondary | ICD-10-CM | POA: Insufficient documentation

## 2013-10-30 DIAGNOSIS — Z6838 Body mass index (BMI) 38.0-38.9, adult: Secondary | ICD-10-CM | POA: Insufficient documentation

## 2013-10-30 DIAGNOSIS — E119 Type 2 diabetes mellitus without complications: Secondary | ICD-10-CM | POA: Diagnosis not present

## 2013-10-30 DIAGNOSIS — E785 Hyperlipidemia, unspecified: Secondary | ICD-10-CM | POA: Insufficient documentation

## 2013-10-30 DIAGNOSIS — I10 Essential (primary) hypertension: Secondary | ICD-10-CM | POA: Insufficient documentation

## 2013-10-30 HISTORY — DX: Inflammatory liver disease, unspecified: K75.9

## 2013-10-30 LAB — CBC WITH DIFFERENTIAL/PLATELET
Basophils Absolute: 0 10*3/uL (ref 0.0–0.1)
Basophils Relative: 0 % (ref 0–1)
Eosinophils Absolute: 0.1 10*3/uL (ref 0.0–0.7)
Eosinophils Relative: 1 % (ref 0–5)
HEMATOCRIT: 38.9 % — AB (ref 39.0–52.0)
Hemoglobin: 13.5 g/dL (ref 13.0–17.0)
Lymphocytes Relative: 35 % (ref 12–46)
Lymphs Abs: 2.4 10*3/uL (ref 0.7–4.0)
MCH: 29.9 pg (ref 26.0–34.0)
MCHC: 34.7 g/dL (ref 30.0–36.0)
MCV: 86.1 fL (ref 78.0–100.0)
Monocytes Absolute: 0.5 10*3/uL (ref 0.1–1.0)
Monocytes Relative: 8 % (ref 3–12)
Neutro Abs: 3.8 10*3/uL (ref 1.7–7.7)
Neutrophils Relative %: 56 % (ref 43–77)
Platelets: 224 10*3/uL (ref 150–400)
RBC: 4.52 MIL/uL (ref 4.22–5.81)
RDW: 15.4 % (ref 11.5–15.5)
WBC: 6.9 10*3/uL (ref 4.0–10.5)

## 2013-10-30 LAB — SURGICAL PCR SCREEN
MRSA, PCR: NEGATIVE
Staphylococcus aureus: NEGATIVE

## 2013-10-30 LAB — TYPE AND SCREEN
ABO/RH(D): O POS
Antibody Screen: NEGATIVE

## 2013-10-30 LAB — URINALYSIS, ROUTINE W REFLEX MICROSCOPIC
Bilirubin Urine: NEGATIVE
GLUCOSE, UA: NEGATIVE mg/dL
Hgb urine dipstick: NEGATIVE
Ketones, ur: NEGATIVE mg/dL
LEUKOCYTES UA: NEGATIVE
Nitrite: NEGATIVE
PROTEIN: NEGATIVE mg/dL
SPECIFIC GRAVITY, URINE: 1.016 (ref 1.005–1.030)
UROBILINOGEN UA: 1 mg/dL (ref 0.0–1.0)
pH: 6.5 (ref 5.0–8.0)

## 2013-10-30 LAB — COMPREHENSIVE METABOLIC PANEL
ALK PHOS: 59 U/L (ref 39–117)
ALT: 32 U/L (ref 0–53)
AST: 30 U/L (ref 0–37)
Albumin: 4.3 g/dL (ref 3.5–5.2)
Anion gap: 13 (ref 5–15)
BUN: 15 mg/dL (ref 6–23)
CO2: 24 mEq/L (ref 19–32)
Calcium: 9.3 mg/dL (ref 8.4–10.5)
Chloride: 102 mEq/L (ref 96–112)
Creatinine, Ser: 1.26 mg/dL (ref 0.50–1.35)
GFR calc non Af Amer: 66 mL/min — ABNORMAL LOW (ref >90)
GFR, EST AFRICAN AMERICAN: 76 mL/min — AB (ref >90)
GLUCOSE: 110 mg/dL — AB (ref 70–99)
Potassium: 3.6 mEq/L — ABNORMAL LOW (ref 3.7–5.3)
SODIUM: 139 meq/L (ref 137–147)
TOTAL PROTEIN: 8 g/dL (ref 6.0–8.3)
Total Bilirubin: 1.1 mg/dL (ref 0.3–1.2)

## 2013-10-30 LAB — ABO/RH: ABO/RH(D): O POS

## 2013-10-30 LAB — PROTIME-INR
INR: 1 (ref 0.00–1.49)
Prothrombin Time: 13.2 seconds (ref 11.6–15.2)

## 2013-10-30 LAB — APTT: aPTT: 30 seconds (ref 24–37)

## 2013-10-30 NOTE — Progress Notes (Signed)
10/30/13 0936  OBSTRUCTIVE SLEEP APNEA  Have you ever been diagnosed with sleep apnea through a sleep study? No  Do you snore loudly (loud enough to be heard through closed doors)?  1  Do you often feel tired, fatigued, or sleepy during the daytime? 0  Has anyone observed you stop breathing during your sleep? 0  Do you have, or are you being treated for high blood pressure? 1  BMI more than 35 kg/m2? 1  Age over 48 years old? 1  Neck circumference greater than 40 cm/16 inches? 1  Gender: 1  Obstructive Sleep Apnea Score 6  Score 4 or greater  Results sent to PCP

## 2013-10-30 NOTE — Pre-Procedure Instructions (Signed)
Kevin FaceJoshua Vanbuskirk Jr.  10/30/2013   Your procedure is scheduled on:  Wednesday, October 14th  [facility].  Call this number if you have problems the morning of surgery: [phone]   Remember:   Do not eat food or drink liquids after midnight.   Take these medicines the morning of surgery with A SIP OF WATER: norvasc, flonase   Do not wear jewelry.  Do not wear lotions, powders, or perfumes. You may wear deodorant.  Do not shave 48 hours prior to surgery. Men may shave Jennings and neck.  Do not bring valuables to the hospital.  [facility] is not responsible for any belongings or valuables.               Contacts, dentures or bridgework may not be worn into surgery.  Leave suitcase in the car. After surgery it may be brought to your room.  For patients admitted to the hospital, discharge time is determined by your treatment team.       Please read over the following fact sheets that you were given: Pain Booklet, Coughing and Deep Breathing, Blood Transfusion Information, MRSA Information and Surgical Site Infection Prevention [place] - Preparing for Surgery  Before surgery, you can play an important role.  Because skin is not sterile, your skin needs to be as free of germs as possible.  You can reduce the number of germs on you skin by washing with CHG (chlorahexidine gluconate) soap before surgery.  CHG is an antiseptic cleaner which kills germs and bonds with the skin to continue killing germs even after washing.  Please DO NOT use if you have an allergy to CHG or antibacterial soaps.  If your skin becomes reddened/irritated stop using the CHG and inform your nurse when you arrive at Short Stay.  Do not shave (including legs and underarms) for at least 48 hours prior to the first CHG shower.  You may shave your Jennings.  Please follow these instructions carefully:   1.  Shower with CHG Soap the night before surgery and the morning of  Surgery.  2.  If you choose to wash your hair, wash your hair first as usual with your normal shampoo.  3.  After you shampoo, rinse your hair and body thoroughly to remove the shampoo.  4.  Use CHG as you would any other liquid soap.  You can apply CHG directly to the skin and wash gently with scrungie or a clean washcloth.  5.  Apply the CHG Soap to your body ONLY FROM THE NECK DOWN.  Do not use on open wounds or open sores.  Avoid contact with your eyes, ears, mouth and genitals (private parts).  Wash genitals (private parts) with your normal soap.  6.  Wash thoroughly, paying special attention to the area where your surgery will be performed.  7.  Thoroughly rinse your body with warm water from the neck down.  8.  DO NOT shower/wash with your normal soap after using and rinsing off the CHG Soap.  9.  Pat yourself dry with a clean towel.            10.  Wear clean pajamas.            11.  Place clean sheets on your bed the night of your first shower and do not sleep with pets.  Day of Surgery  Do not apply any lotions/deoderants the morning of surgery.  Please  wear clean clothes to the hospital/surgery center.

## 2013-10-30 NOTE — Progress Notes (Signed)
Primary - dr. Pennie Banterhau lui ([facility]) No cardiologist ekg in feb 2015

## 2013-10-31 LAB — URINE CULTURE
COLONY COUNT: NO GROWTH
Culture: NO GROWTH
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
## 2013-11-05 ENCOUNTER — Inpatient Hospital Stay (HOSPITAL_COMMUNITY): Payer: PRIVATE HEALTH INSURANCE

## 2013-11-05 ENCOUNTER — Inpatient Hospital Stay (HOSPITAL_COMMUNITY): Payer: PRIVATE HEALTH INSURANCE | Admitting: Anesthesiology

## 2013-11-05 ENCOUNTER — Encounter (HOSPITAL_COMMUNITY)
Admission: RE | Disposition: A | Discharge status: Home Health | Payer: Self-pay | Source: Ambulatory Visit | Attending: Orthopaedic Surgery

## 2013-11-05 ENCOUNTER — Encounter (HOSPITAL_COMMUNITY): Payer: Self-pay | Admitting: Anesthesiology

## 2013-11-05 ENCOUNTER — Inpatient Hospital Stay (HOSPITAL_COMMUNITY)
Admission: RE | Admit: 2013-11-05 | Discharge: 2013-11-07 | DRG: 470 | Disposition: A | Discharge status: Home Health | Payer: PRIVATE HEALTH INSURANCE | Source: Ambulatory Visit | Attending: Orthopaedic Surgery | Admitting: Orthopaedic Surgery

## 2013-11-05 ENCOUNTER — Encounter (HOSPITAL_COMMUNITY): Payer: PRIVATE HEALTH INSURANCE | Admitting: Anesthesiology

## 2013-11-05 DIAGNOSIS — E785 Hyperlipidemia, unspecified: Secondary | ICD-10-CM | POA: Diagnosis present

## 2013-11-05 DIAGNOSIS — E119 Type 2 diabetes mellitus without complications: Secondary | ICD-10-CM | POA: Diagnosis present

## 2013-11-05 DIAGNOSIS — D62 Acute posthemorrhagic anemia: Secondary | ICD-10-CM | POA: Diagnosis not present

## 2013-11-05 DIAGNOSIS — I10 Essential (primary) hypertension: Secondary | ICD-10-CM | POA: Diagnosis not present

## 2013-11-05 DIAGNOSIS — G629 Polyneuropathy, unspecified: Secondary | ICD-10-CM | POA: Diagnosis present

## 2013-11-05 DIAGNOSIS — Z7982 Long term (current) use of aspirin: Secondary | ICD-10-CM | POA: Diagnosis not present

## 2013-11-05 DIAGNOSIS — F1290 Cannabis use, unspecified, uncomplicated: Secondary | ICD-10-CM | POA: Diagnosis present

## 2013-11-05 DIAGNOSIS — F1490 Cocaine use, unspecified, uncomplicated: Secondary | ICD-10-CM | POA: Diagnosis present

## 2013-11-05 DIAGNOSIS — G8918 Other acute postprocedural pain: Secondary | ICD-10-CM | POA: Diagnosis not present

## 2013-11-05 DIAGNOSIS — M169 Osteoarthritis of hip, unspecified: Secondary | ICD-10-CM

## 2013-11-05 DIAGNOSIS — Z79899 Other long term (current) drug therapy: Secondary | ICD-10-CM | POA: Diagnosis not present

## 2013-11-05 DIAGNOSIS — M1610 Unilateral primary osteoarthritis, unspecified hip: Secondary | ICD-10-CM | POA: Diagnosis present

## 2013-11-05 DIAGNOSIS — M1612 Unilateral primary osteoarthritis, left hip: Secondary | ICD-10-CM | POA: Diagnosis not present

## 2013-11-05 HISTORY — PX: TOTAL HIP ARTHROPLASTY: SHX124

## 2013-11-05 LAB — RAPID URINE DRUG SCREEN, HOSP PERFORMED
AMPHETAMINES: NOT DETECTED
Barbiturates: NOT DETECTED
Benzodiazepines: NOT DETECTED
COCAINE: NOT DETECTED
Opiates: NOT DETECTED
TETRAHYDROCANNABINOL: NOT DETECTED

## 2013-11-05 LAB — GLUCOSE, CAPILLARY
GLUCOSE-CAPILLARY: 116 mg/dL — AB (ref 70–99)
GLUCOSE-CAPILLARY: 191 mg/dL — AB (ref 70–99)

## 2013-11-05 SURGERY — ARTHROPLASTY, HIP, TOTAL, ANTERIOR APPROACH
Anesthesia: General | Site: Hip | Laterality: Left

## 2013-11-05 MED ORDER — METFORMIN HCL 500 MG PO TABS
500.0000 mg | ORAL_TABLET | Freq: Every day | ORAL | Status: DC
Start: 2013-11-06 — End: 2013-11-07
  Administered 2013-11-06: 500 mg via ORAL
  Filled 2013-11-05 (×3): qty 1

## 2013-11-05 MED ORDER — PROPOFOL 10 MG/ML IV BOLUS
INTRAVENOUS | Status: AC
  Filled 2013-11-05: qty 20

## 2013-11-05 MED ORDER — CHLORHEXIDINE GLUCONATE 4 % EX LIQD
60.0000 mL | Freq: Once | CUTANEOUS | Status: DC
  Filled 2013-11-05: qty 60

## 2013-11-05 MED ORDER — CEFAZOLIN SODIUM-DEXTROSE 2-3 GM-% IV SOLR
INTRAVENOUS | Status: DC | PRN
  Administered 2013-11-05: 2 g via INTRAVENOUS

## 2013-11-05 MED ORDER — LIDOCAINE HCL (CARDIAC) 20 MG/ML IV SOLN
INTRAVENOUS | Status: DC | PRN
  Administered 2013-11-05: 100 mg via INTRAVENOUS

## 2013-11-05 MED ORDER — SUFENTANIL CITRATE 50 MCG/ML IV SOLN
INTRAVENOUS | Status: DC | PRN
  Administered 2013-11-05 (×5): 10 ug via INTRAVENOUS

## 2013-11-05 MED ORDER — METOCLOPRAMIDE HCL 5 MG PO TABS
5.0000 mg | ORAL_TABLET | Freq: Three times a day (TID) | ORAL | Status: DC | PRN
  Filled 2013-11-05: qty 2

## 2013-11-05 MED ORDER — NEOSTIGMINE METHYLSULFATE 10 MG/10ML IV SOLN
INTRAVENOUS | Status: DC | PRN
  Administered 2013-11-05: 3 mg via INTRAVENOUS

## 2013-11-05 MED ORDER — ALUM & MAG HYDROXIDE-SIMETH 200-200-20 MG/5ML PO SUSP: 30.0000 mL | ORAL | Status: DC | PRN

## 2013-11-05 MED ORDER — ATORVASTATIN CALCIUM 20 MG PO TABS
20.0000 mg | ORAL_TABLET | Freq: Every day | ORAL | Status: DC
  Administered 2013-11-05 – 2013-11-06 (×2): 20 mg via ORAL
  Filled 2013-11-05 (×4): qty 1

## 2013-11-05 MED ORDER — ACETAMINOPHEN 325 MG PO TABS
650.0000 mg | ORAL_TABLET | Freq: Four times a day (QID) | ORAL | Status: DC | PRN
  Administered 2013-11-05: 650 mg via ORAL

## 2013-11-05 MED ORDER — SENNOSIDES-DOCUSATE SODIUM 8.6-50 MG PO TABS: 1.0000 | ORAL_TABLET | Freq: Every evening | ORAL | Status: DC | PRN

## 2013-11-05 MED ORDER — MORPHINE SULFATE 2 MG/ML IJ SOLN: 2.0000 mg | INTRAMUSCULAR | Status: DC | PRN

## 2013-11-05 MED ORDER — TRANEXAMIC ACID 100 MG/ML IV SOLN
1000.0000 mg | INTRAVENOUS | Status: AC
  Administered 2013-11-05: 1000 mg via INTRAVENOUS
  Filled 2013-11-05: qty 10

## 2013-11-05 MED ORDER — INSULIN ASPART 100 UNIT/ML ~~LOC~~ SOLN
0.0000 [IU] | Freq: Three times a day (TID) | SUBCUTANEOUS | Status: DC
  Administered 2013-11-06: 3 [IU] via SUBCUTANEOUS
  Administered 2013-11-06 (×2): 2 [IU] via SUBCUTANEOUS

## 2013-11-05 MED ORDER — OLOPATADINE HCL 0.1 % OP SOLN
1.0000 [drp] | Freq: Two times a day (BID) | OPHTHALMIC | Status: DC
Start: 2013-11-05 — End: 2013-11-07
  Administered 2013-11-05 – 2013-11-06 (×2): 1 [drp] via OPHTHALMIC
  Filled 2013-11-05 (×2): qty 5

## 2013-11-05 MED ORDER — FENTANYL CITRATE 0.05 MG/ML IJ SOLN
INTRAMUSCULAR | Status: AC
  Filled 2013-11-05: qty 5

## 2013-11-05 MED ORDER — ASPIRIN EC 325 MG PO TBEC: 325.0000 mg | DELAYED_RELEASE_TABLET | Freq: Two times a day (BID) | ORAL | Status: DC

## 2013-11-05 MED ORDER — MORPHINE SULFATE 2 MG/ML IJ SOLN
INTRAMUSCULAR | Status: AC
  Administered 2013-11-05: 2 mg via INTRAVENOUS
  Filled 2013-11-05: qty 1

## 2013-11-05 MED ORDER — ROCURONIUM BROMIDE 50 MG/5ML IV SOLN
INTRAVENOUS | Status: AC
Start: 2013-11-05 — End: 2013-11-05
  Filled 2013-11-05: qty 1

## 2013-11-05 MED ORDER — FLUTICASONE PROPIONATE 50 MCG/ACT NA SUSP
1.0000 | Freq: Every day | NASAL | Status: DC
Start: 2013-11-06 — End: 2013-11-07
  Administered 2013-11-06: 1 via NASAL
  Filled 2013-11-05 (×2): qty 16

## 2013-11-05 MED ORDER — METOCLOPRAMIDE HCL 5 MG/ML IJ SOLN: 5.0000 mg | Freq: Three times a day (TID) | INTRAMUSCULAR | Status: DC | PRN

## 2013-11-05 MED ORDER — CEFAZOLIN SODIUM-DEXTROSE 2-3 GM-% IV SOLR: 2.0000 g | INTRAVENOUS | Status: DC

## 2013-11-05 MED ORDER — ONDANSETRON HCL 4 MG/2ML IJ SOLN
INTRAMUSCULAR | Status: DC | PRN
  Administered 2013-11-05: 4 mg via INTRAVENOUS

## 2013-11-05 MED ORDER — CEFAZOLIN SODIUM-DEXTROSE 2-3 GM-% IV SOLR
2.0000 g | Freq: Four times a day (QID) | INTRAVENOUS | Status: AC
  Administered 2013-11-05 – 2013-11-06 (×2): 2 g via INTRAVENOUS
  Filled 2013-11-05 (×3): qty 50

## 2013-11-05 MED ORDER — HYDROCHLOROTHIAZIDE 25 MG PO TABS
25.0000 mg | ORAL_TABLET | Freq: Every morning | ORAL | Status: DC
  Administered 2013-11-06: 25 mg via ORAL
  Filled 2013-11-05 (×2): qty 1

## 2013-11-05 MED ORDER — HYDROMORPHONE HCL 1 MG/ML IJ SOLN
INTRAMUSCULAR | Status: AC
  Filled 2013-11-05: qty 1

## 2013-11-05 MED ORDER — HYDROMORPHONE HCL 1 MG/ML IJ SOLN
0.2500 mg | INTRAMUSCULAR | Status: DC | PRN
  Administered 2013-11-05 (×3): 0.5 mg via INTRAVENOUS

## 2013-11-05 MED ORDER — SORBITOL 70 % SOLN
30.0000 mL | Freq: Every day | Status: DC | PRN
  Filled 2013-11-05: qty 30

## 2013-11-05 MED ORDER — OXYCODONE HCL 5 MG PO TABS
ORAL_TABLET | ORAL | Status: AC
  Administered 2013-11-05: 10 mg via ORAL
  Filled 2013-11-05: qty 2

## 2013-11-05 MED ORDER — DEXAMETHASONE SODIUM PHOSPHATE 4 MG/ML IJ SOLN
INTRAMUSCULAR | Status: DC | PRN
  Administered 2013-11-05: 4 mg via INTRAVENOUS

## 2013-11-05 MED ORDER — OXYCODONE HCL 5 MG PO TABS: 5.0000 mg | ORAL_TABLET | ORAL | Status: DC | PRN

## 2013-11-05 MED ORDER — NEOSTIGMINE METHYLSULFATE 10 MG/10ML IV SOLN
INTRAVENOUS | Status: AC
  Filled 2013-11-05: qty 1

## 2013-11-05 MED ORDER — ACETAMINOPHEN 500 MG PO TABS
1000.0000 mg | ORAL_TABLET | Freq: Four times a day (QID) | ORAL | Status: AC
  Administered 2013-11-06 (×3): 1000 mg via ORAL
  Filled 2013-11-05 (×4): qty 2

## 2013-11-05 MED ORDER — ASPIRIN EC 325 MG PO TBEC
325.0000 mg | DELAYED_RELEASE_TABLET | Freq: Two times a day (BID) | ORAL | Status: DC
Start: 2013-11-05 — End: 2013-11-07
  Administered 2013-11-05 – 2013-11-06 (×3): 325 mg via ORAL
  Filled 2013-11-05 (×6): qty 1

## 2013-11-05 MED ORDER — ACETAMINOPHEN 650 MG RE SUPP: 650.0000 mg | Freq: Four times a day (QID) | RECTAL | Status: DC | PRN

## 2013-11-05 MED ORDER — SODIUM CHLORIDE 0.9 % IV SOLN
INTRAVENOUS | Status: DC
  Administered 2013-11-05 – 2013-11-06 (×2): via INTRAVENOUS

## 2013-11-05 MED ORDER — ENALAPRIL MALEATE 20 MG PO TABS
20.0000 mg | ORAL_TABLET | Freq: Every morning | ORAL | Status: DC
  Administered 2013-11-06: 20 mg via ORAL
  Filled 2013-11-05 (×2): qty 1

## 2013-11-05 MED ORDER — AMLODIPINE BESYLATE 10 MG PO TABS
10.0000 mg | ORAL_TABLET | Freq: Every morning | ORAL | Status: DC
  Administered 2013-11-06: 10 mg via ORAL
  Filled 2013-11-05 (×2): qty 1

## 2013-11-05 MED ORDER — DEXAMETHASONE SODIUM PHOSPHATE 4 MG/ML IJ SOLN
INTRAMUSCULAR | Status: AC
  Filled 2013-11-05: qty 1

## 2013-11-05 MED ORDER — INSULIN ASPART 100 UNIT/ML ~~LOC~~ SOLN: 0.0000 [IU] | Freq: Every day | SUBCUTANEOUS | Status: DC

## 2013-11-05 MED ORDER — LIDOCAINE HCL (CARDIAC) 20 MG/ML IV SOLN
INTRAVENOUS | Status: AC
  Filled 2013-11-05: qty 5

## 2013-11-05 MED ORDER — KETOROLAC TROMETHAMINE 30 MG/ML IJ SOLN
30.0000 mg | Freq: Four times a day (QID) | INTRAMUSCULAR | Status: DC | PRN
  Administered 2013-11-05: 30 mg via INTRAVENOUS

## 2013-11-05 MED ORDER — SENNA 8.6 MG PO TABS
1.0000 | ORAL_TABLET | Freq: Two times a day (BID) | ORAL | Status: DC
Start: 2013-11-05 — End: 2013-11-07
  Administered 2013-11-05 – 2013-11-06 (×3): 8.6 mg via ORAL
  Filled 2013-11-05 (×6): qty 1

## 2013-11-05 MED ORDER — TRAZODONE HCL 150 MG PO TABS
150.0000 mg | ORAL_TABLET | Freq: Every day | ORAL | Status: DC
  Administered 2013-11-05 – 2013-11-06 (×2): 150 mg via ORAL
  Filled 2013-11-05 (×4): qty 1

## 2013-11-05 MED ORDER — MAGNESIUM CITRATE PO SOLN
1.0000 | Freq: Once | ORAL | Status: AC | PRN
  Filled 2013-11-05: qty 296

## 2013-11-05 MED ORDER — GLYCOPYRROLATE 0.2 MG/ML IJ SOLN
INTRAMUSCULAR | Status: DC | PRN
  Administered 2013-11-05: 0.6 mg via INTRAVENOUS

## 2013-11-05 MED ORDER — PHENOL 1.4 % MT LIQD: 1.0000 | OROMUCOSAL | Status: DC | PRN

## 2013-11-05 MED ORDER — KETOROLAC TROMETHAMINE 30 MG/ML IJ SOLN
INTRAMUSCULAR | Status: AC
  Filled 2013-11-05: qty 1

## 2013-11-05 MED ORDER — ROCURONIUM BROMIDE 100 MG/10ML IV SOLN
INTRAVENOUS | Status: DC | PRN
  Administered 2013-11-05: 50 mg via INTRAVENOUS

## 2013-11-05 MED ORDER — SUFENTANIL CITRATE 50 MCG/ML IV SOLN
INTRAVENOUS | Status: AC
  Filled 2013-11-05: qty 1

## 2013-11-05 MED ORDER — ACETAMINOPHEN 325 MG PO TABS
ORAL_TABLET | ORAL | Status: AC
  Administered 2013-11-05: 650 mg via ORAL
  Filled 2013-11-05: qty 2

## 2013-11-05 MED ORDER — CELECOXIB 200 MG PO CAPS
200.0000 mg | ORAL_CAPSULE | Freq: Two times a day (BID) | ORAL | Status: DC
  Administered 2013-11-05 – 2013-11-06 (×3): 200 mg via ORAL
  Filled 2013-11-05 (×6): qty 1

## 2013-11-05 MED ORDER — ONDANSETRON HCL 4 MG/2ML IJ SOLN
INTRAMUSCULAR | Status: AC
  Filled 2013-11-05: qty 2

## 2013-11-05 MED ORDER — PROPOFOL 10 MG/ML IV BOLUS
INTRAVENOUS | Status: DC | PRN
  Administered 2013-11-05: 170 mg via INTRAVENOUS

## 2013-11-05 MED ORDER — ONDANSETRON HCL 4 MG/2ML IJ SOLN
4.0000 mg | Freq: Four times a day (QID) | INTRAMUSCULAR | Status: DC | PRN
Start: 2013-11-05 — End: 2013-11-07

## 2013-11-05 MED ORDER — MENTHOL 3 MG MT LOZG: 1.0000 | LOZENGE | OROMUCOSAL | Status: DC | PRN

## 2013-11-05 MED ORDER — POLYETHYLENE GLYCOL 3350 17 G PO PACK: 17.0000 g | PACK | Freq: Every day | ORAL | Status: DC | PRN

## 2013-11-05 MED ORDER — OXYCODONE HCL 5 MG PO TABS
5.0000 mg | ORAL_TABLET | ORAL | Status: DC | PRN
  Administered 2013-11-05 – 2013-11-07 (×6): 10 mg via ORAL
  Filled 2013-11-05 (×5): qty 2

## 2013-11-05 MED ORDER — MIDAZOLAM HCL 2 MG/2ML IJ SOLN
INTRAMUSCULAR | Status: AC
  Filled 2013-11-05: qty 2

## 2013-11-05 MED ORDER — MIDAZOLAM HCL 5 MG/5ML IJ SOLN
INTRAMUSCULAR | Status: DC | PRN
  Administered 2013-11-05: 2 mg via INTRAVENOUS

## 2013-11-05 MED ORDER — OXYCODONE HCL 5 MG PO TABS: 5.0000 mg | ORAL_TABLET | Freq: Once | ORAL | Status: DC | PRN

## 2013-11-05 MED ORDER — OXYCODONE HCL 5 MG/5ML PO SOLN: 5.0000 mg | Freq: Once | ORAL | Status: DC | PRN

## 2013-11-05 MED ORDER — DIPHENHYDRAMINE HCL 12.5 MG/5ML PO ELIX: 25.0000 mg | ORAL_SOLUTION | ORAL | Status: DC | PRN

## 2013-11-05 MED ORDER — PROMETHAZINE HCL 25 MG/ML IJ SOLN: 6.2500 mg | INTRAMUSCULAR | Status: DC | PRN

## 2013-11-05 MED ORDER — GLYCOPYRROLATE 0.2 MG/ML IJ SOLN
INTRAMUSCULAR | Status: AC
  Filled 2013-11-05: qty 3

## 2013-11-05 MED ORDER — FENTANYL CITRATE 0.05 MG/ML IJ SOLN
INTRAMUSCULAR | Status: DC | PRN
  Administered 2013-11-05: 50 ug via INTRAVENOUS
  Administered 2013-11-05 (×2): 100 ug via INTRAVENOUS

## 2013-11-05 MED ORDER — 0.9 % SODIUM CHLORIDE (POUR BTL) OPTIME
TOPICAL | Status: DC | PRN
  Administered 2013-11-05: 1000 mL

## 2013-11-05 MED ORDER — HYDROMORPHONE HCL 1 MG/ML IJ SOLN
INTRAMUSCULAR | Status: AC
  Administered 2013-11-05: 0.5 mg via INTRAVENOUS
  Filled 2013-11-05: qty 1

## 2013-11-05 MED ORDER — ONDANSETRON HCL 4 MG PO TABS: 4.0000 mg | ORAL_TABLET | Freq: Four times a day (QID) | ORAL | Status: DC | PRN

## 2013-11-05 MED ORDER — SODIUM CHLORIDE 0.9 % IJ SOLN
INTRAMUSCULAR | Status: AC
  Filled 2013-11-05: qty 10

## 2013-11-05 SURGICAL SUPPLY — 52 items
ADH SKN CLS APL DERMABOND .7 (GAUZE/BANDAGES/DRESSINGS)
BLADE SAW SGTL 18X1.27X75 (BLADE) ×2 IMPLANT
BLADE SAW SGTL 18X1.27X75MM (BLADE) ×1
CELLS DAT CNTRL 66122 CELL SVR (MISCELLANEOUS) ×2 IMPLANT
CLOSURE WOUND 1/2 X4 (GAUZE/BANDAGES/DRESSINGS)
COVER SURGICAL LIGHT HANDLE (MISCELLANEOUS) ×2 IMPLANT
DERMABOND ADVANCED (GAUZE/BANDAGES/DRESSINGS)
DERMABOND ADVANCED .7 DNX12 (GAUZE/BANDAGES/DRESSINGS) IMPLANT
DRAPE C-ARM 42X72 X-RAY (DRAPES) ×3 IMPLANT
DRAPE STERI IOBAN 125X83 (DRAPES) ×3 IMPLANT
DRAPE SURG 17X11 SM STRL (DRAPES) ×6 IMPLANT
DRAPE U-SHAPE 47X51 STRL (DRAPES) ×9 IMPLANT
DRAPE UNIVERSAL PACK (DRAPES) ×2 IMPLANT
DRSG AQUACEL AG ADV 3.5X10 (GAUZE/BANDAGES/DRESSINGS) ×3 IMPLANT
DURAPREP 26ML APPLICATOR (WOUND CARE) ×3 IMPLANT
ELECT BLADE 4.0 EZ CLEAN MEGAD (MISCELLANEOUS) ×3
ELECT BLADE TIP CTD 4 INCH (ELECTRODE) ×3 IMPLANT
ELECT PENCIL ROCKER SW 15FT (MISCELLANEOUS) ×2 IMPLANT
ELECT REM PT RETURN 9FT ADLT (ELECTROSURGICAL) ×3
ELECTRODE BLDE 4.0 EZ CLN MEGD (MISCELLANEOUS) IMPLANT
ELECTRODE REM PT RTRN 9FT ADLT (ELECTROSURGICAL) ×1 IMPLANT
FACESHIELD STD STERILE (MASK) ×2 IMPLANT
FACESHIELD WRAPAROUND (MASK) ×6 IMPLANT
FACESHIELD WRAPAROUND OR TEAM (MASK) ×4 IMPLANT
GAUZE XEROFORM 5X9 LF (GAUZE/BANDAGES/DRESSINGS) ×1 IMPLANT
GLOVE SURG SYN 7.5  E (GLOVE) ×4
GLOVE SURG SYN 7.5 E (GLOVE) ×2 IMPLANT
GLOVE SURG SYN 7.5 PF PI (GLOVE) ×2 IMPLANT
GOWN STRL REUS W/TWL XL LVL3 (GOWN DISPOSABLE) ×4 IMPLANT
HANDPIECE INTERPULSE COAX TIP (DISPOSABLE) ×3
HIP FOROUS FEM W/OX HD RS XLPE ×2 IMPLANT
KIT BASIN OR (CUSTOM PROCEDURE TRAY) ×3 IMPLANT
PACK TOTAL JOINT (CUSTOM PROCEDURE TRAY) ×3 IMPLANT
PADDING CAST COTTON 6X4 STRL (CAST SUPPLIES) ×6 IMPLANT
PEN SKIN MARKING BROAD (MISCELLANEOUS) ×3 IMPLANT
RETRACTOR WND ALEXIS 18 MED (MISCELLANEOUS) ×2 IMPLANT
RTRCTR WOUND ALEXIS 18CM MED (MISCELLANEOUS) ×6
SET HNDPC FAN SPRY TIP SCT (DISPOSABLE) ×1 IMPLANT
STAPLER VISISTAT 35W (STAPLE) ×2 IMPLANT
STRIP CLOSURE SKIN 1/2X4 (GAUZE/BANDAGES/DRESSINGS) ×1 IMPLANT
SUT MNCRL AB 3-0 PS2 18 (SUTURE) ×3 IMPLANT
SUT VIC AB 0 CT1 27 (SUTURE) ×6
SUT VIC AB 0 CT1 27XBRD ANBCTR (SUTURE) ×1 IMPLANT
SUT VIC AB 1 CT1 27 (SUTURE) ×3
SUT VIC AB 1 CT1 27XBRD ANBCTR (SUTURE) IMPLANT
SUT VIC AB 2-0 CT1 27 (SUTURE) ×9
SUT VIC AB 2-0 CT1 TAPERPNT 27 (SUTURE) ×2 IMPLANT
SYRINGE 35CC LL (MISCELLANEOUS) ×2 IMPLANT
TOWEL OR 17X26 10 PK STRL BLUE (TOWEL DISPOSABLE) ×3 IMPLANT
TOWEL OR NON WOVEN STRL DISP B (DISPOSABLE) ×1 IMPLANT
TRAY FOLEY CATH 16FR SILVER (SET/KITS/TRAYS/PACK) ×3 IMPLANT
TUBE SUCT ARGYLE STRL (TUBING) ×3 IMPLANT

## 2013-11-05 NOTE — Op Note (Addendum)
LEFT TOTAL HIP ARTHROPLASTY ANTERIOR APPROACH  Procedure Note Verdell FaceJoshua Mondry Jr.   [id number]  Pre-op Diagnosis: Left hip osteoarthritis     Post-op Diagnosis: same   Operative Procedures  1. Total hip replacement; Left hip; uncemented [id number]   Personnel  Surgeon(s): Naiping Glee ArvinMichael Xu, MD Kathryne Hitchhristopher Y Blackman, MD  Assistant surgeon: Doneen Poissonhristopher Blackman, MD (necessary for the timely completion of the case)   Anesthesia: General  Prosthesis: Smith and Nephew Acetabulum: R3 56 mm Femur: Anthology 4 HO Head: 36 size: +4 Bearing Type: Oxinium on poly  Date of Service: 11/05/2013  Total Hip Arthroplasty (Anterior Approach) Op Note:  After informed consent was obtained and the operative extremity marked in the holding area, the patient was brought back to the operating room and placed supine on the HANA table. Next, the operative extremity was prepped and draped in normal sterile fashion. Surgical timeout occurred verifying patient identification, surgical site, surgical procedure and administration of antibiotics.  A modified anterior Smith-Peterson approach to the hip was performed, using the interval between tensor fascia lata and sartorius.  Dissection was carried bluntly down onto the anterior hip capsule. The lateral femoral circumflex vessels were identified and coagulated. A capsulotomy was performed and the capsular flaps tagged for later repair.  Fluoroscopy was utilized to prepare for the femoral neck cut. The neck osteotomy was performed. The femoral head was removed, the acetabular rim was cleared of soft tissue and attention was turned to reaming the acetabulum.  Sequential reaming was performed under fluoroscopic guidance. We reamed to a size 56 mm, and then impacted the acetabular shell. The liner was then placed after irrigation and attention turned to the femur.  After placing the femoral hook, the leg was taken to externally rotated, extended and adducted position  taking care to perform soft tissue releases to allow for adequate mobilization of the femur. Soft tissue was cleared from the shoulder of the greater trochanter and the hook elevator used to improve exposure of the proximal femur. Sequential broaching performed up to a size 4. Trial neck and head were placed. The leg was brought back up to neutral and the construct reduced. The position and sizing of components, offset and leg lengths were checked using fluoroscopy. Stability of the  construct was checked in extension and external rotation without any subluxation or impingement of prosthesis. We dislocated the prosthesis, dropped the leg back into position, removed trial components, and irrigated copiously. The final stem and head was then placed, the leg brought back up, the system reduced and fluoroscopy used to verify positioning.  We irrigated, obtained hemostasis and closed the capsule using #5 ethibond suture.  Dilute betadyne solution was used.  The fascia was closed with #1 vicryl plus, the deep fat layer was closed with 0 vicryl, the subcutaneous layers closed with 2.0 Vicryl Plus and the skin closed with staples. A sterile dressing was applied. The patient was awakened in the operating room and  taken to recovery in stable condition. All sponge, needle, and instrument counts were correct at the end of the case.   Position: supine  Complications: none.  Time Out: performed   Drains/Packing: none  Estimated blood loss: 300 cc  Returned to Recovery Room: in good condition.   Antibiotics: yes   Mechanical VTE (DVT) Prophylaxis: sequential compression devices, TED thigh-high  Chemical VTE (DVT) Prophylaxis: aspirin (other pharmacologic prophylaxis not  indicated - bleeding risk)   Fluid Replacement  Crystalloid: 2100 cc  Blood: none  FFP: none  Specimens Removed: 1 to pathology   Sponge and Instrument Count Correct? yes   PACU: portable radiograph - low AP   Admission:  inpatient status, start PT & OT POD#1  Plan/RTC: Return in 2 weeks for staple removal. Return in 6 weeks to see MD.  Weight Bearing/Load Lower Extremity: full  Hip precautions: none Suture Removal: 10-14 days  Betadine to incision twice daily once dressing is removed on POD#7  N. Michael Xu, MD [facility] [phone] Arvin632 3:00 PM      Implant Name Type Inv. Item Serial No. Manufacturer Lot No. LRB No. Used  56 MM OD LINER 3 HOLE HEMISPHERICAL STIKTITE COATED SHELL    [employer] [id number] Left 1  XLPE 0 DEGREE ACETABULAR LINER     [employer] [id number] Left 1  SIZE 4 HIGH OFFSET FEMORAL COMPONENT    [employer] [id number] Left 1  HEAD LG OXINIUM PLS HIP 4 36MM - [id number] Hips [id number]   [employer] [id number] Left 1

## 2013-11-05 NOTE — Anesthesia Preprocedure Evaluation (Addendum)
Anesthesia Evaluation  Patient identified by MRN, date of birth, ID band Patient awake    Reviewed: Allergy & Precautions, H&P , NPO status , Patient's Chart, lab work & pertinent test results  Airway Mallampati: III TM Distance: >3 FB Neck ROM: Full    Dental  (+) Dental Advisory Given, Teeth Intact   Pulmonary neg pulmonary ROS,  breath sounds clear to auscultation        Cardiovascular hypertension, Pt. on medications Rhythm:Regular Rate:Normal     Neuro/Psych negative neurological ROS  negative psych ROS   GI/Hepatic negative GI ROS, (+)     substance abuse (tox screen negative)  cocaine use,   Endo/Other  diabetes, Type 2, Oral Hypoglycemic AgentsMorbid obesity  Renal/GU negative Renal ROS     Musculoskeletal  (+) Arthritis -,   Abdominal   Peds  Hematology negative hematology ROS (+)   Anesthesia Other Findings   Reproductive/Obstetrics                          Anesthesia Physical Anesthesia Plan  ASA: III  Anesthesia Plan: General   Post-op Pain Management:    Induction: Intravenous  Airway Management Planned: Oral ETT  Additional Equipment:   Intra-op Plan:   Post-operative Plan: Extubation in OR  Informed Consent: I have reviewed the patients History and Physical, chart, labs and discussed the procedure including the risks, benefits and alternatives for the proposed anesthesia with the patient or authorized representative who has indicated his/her understanding and acceptance.     Plan Discussed with: CRNA and Surgeon  Anesthesia Plan Comments:         Anesthesia Quick Evaluation

## 2013-11-05 NOTE — Transfer of Care (Signed)
Immediate Anesthesia Transfer of Care Note  Patient: Kevin FaceJoshua Martis Jr.  Procedure(s) Performed: Procedure(s): LEFT TOTAL HIP ARTHROPLASTY ANTERIOR APPROACH (Left)  Patient Location: PACU  Anesthesia Type:General  Level of Consciousness: awake, alert  and patient cooperative  Airway & Oxygen Therapy: Patient Spontanous Breathing and Patient connected to face mask oxygen  Post-op Assessment: Report given to PACU RN, Post -op Vital signs reviewed and stable, Patient moving all extremities and Patient moving all extremities X 4  Post vital signs: Reviewed and stable  Complications: No apparent anesthesia complications

## 2013-11-05 NOTE — Anesthesia Procedure Notes (Signed)
Procedure Name: Intubation Date/Time: 11/05/2013 12:01 PM Performed by: Melina SchoolsBANKS, Eryk Beavers J Pre-anesthesia Checklist: Patient identified, Emergency Drugs available, Suction available, Patient being monitored and Timeout performed Patient Re-evaluated:Patient Re-evaluated prior to inductionOxygen Delivery Method: Circle system utilized Preoxygenation: Pre-oxygenation with 100% oxygen Intubation Type: IV induction Ventilation: Mask ventilation without difficulty Laryngoscope Size: Mac and 3 Grade View: Grade I Tube type: Oral Tube size: 7.5 mm Number of attempts: 1 Airway Equipment and Method: Stylet Placement Confirmation: ETT inserted through vocal cords under direct vision,  positive ETCO2 and breath sounds checked- equal and bilateral Secured at: 23 cm Tube secured with: Tape Dental Injury: Teeth and Oropharynx as per pre-operative assessment

## 2013-11-05 NOTE — Anesthesia Postprocedure Evaluation (Signed)
Anesthesia Post Note  Patient: Kevin FaceJoshua Sutliff Jr.  Procedure(s) Performed: Procedure(s) (LRB): LEFT TOTAL HIP ARTHROPLASTY ANTERIOR APPROACH (Left)  Anesthesia type: General  Patient location: PACU  Post pain: Pain level controlled and Adequate analgesia  Post assessment: Post-op Vital signs reviewed, Patient's Cardiovascular Status Stable, Respiratory Function Stable, Patent Airway and Pain level controlled  Last Vitals:  Filed Vitals:   11/05/13 1545  BP: 140/98  Pulse: 86  Temp:   Resp: 21    Post vital signs: Reviewed and stable  Level of consciousness: awake, alert  and oriented  Complications: No apparent anesthesia complications

## 2013-11-05 NOTE — Discharge Instructions (Signed)
1. Remove dressing on 11/12/2013.  2. Begin betadine paints to incision twice a day with dry dressing. 3. May get incision wet while showering after 10 days after surgery.  Do not submerge incision until notified. 4. WBAT 5. Ice the operative 20 minutes on and 20 minutes off around the clock.  6. Avoid any dental visits including dental cleanings for 6 months after surgery.

## 2013-11-06 ENCOUNTER — Encounter (HOSPITAL_COMMUNITY): Payer: Self-pay | Admitting: General Practice

## 2013-11-06 LAB — GLUCOSE, CAPILLARY
GLUCOSE-CAPILLARY: 133 mg/dL — AB (ref 70–99)
Glucose-Capillary: 122 mg/dL — ABNORMAL HIGH (ref 70–99)
Glucose-Capillary: 164 mg/dL — ABNORMAL HIGH (ref 70–99)
Glucose-Capillary: 113 mg/dL — ABNORMAL HIGH (ref 70–99)

## 2013-11-06 LAB — BASIC METABOLIC PANEL
Anion gap: 13 (ref 5–15)
BUN: 19 mg/dL (ref 6–23)
CO2: 22 mEq/L (ref 19–32)
Calcium: 8.6 mg/dL (ref 8.4–10.5)
Chloride: 103 mEq/L (ref 96–112)
Creatinine, Ser: 1.16 mg/dL (ref 0.50–1.35)
GFR, EST AFRICAN AMERICAN: 84 mL/min — AB (ref >90)
GFR, EST NON AFRICAN AMERICAN: 73 mL/min — AB (ref >90)
GLUCOSE: 130 mg/dL — AB (ref 70–99)
POTASSIUM: 4 meq/L (ref 3.7–5.3)
SODIUM: 138 meq/L (ref 137–147)

## 2013-11-06 LAB — CBC
HCT: 33.9 % — ABNORMAL LOW (ref 39.0–52.0)
Hemoglobin: 11.7 g/dL — ABNORMAL LOW (ref 13.0–17.0)
MCH: 29.8 pg (ref 26.0–34.0)
MCHC: 34.5 g/dL (ref 30.0–36.0)
MCV: 86.5 fL (ref 78.0–100.0)
Platelets: 218 10*3/uL (ref 150–400)
RBC: 3.92 MIL/uL — AB (ref 4.22–5.81)
RDW: 15.5 % (ref 11.5–15.5)
WBC: 8 10*3/uL (ref 4.0–10.5)

## 2013-11-06 NOTE — Progress Notes (Signed)
   Subjective:  Patient reports pain as moderate.  No events.  Objective:   VITALS:   Filed Vitals:   11/06/13 0000 11/06/13 0155 11/06/13 0400 11/06/13 0524  BP:  141/78  135/81  Pulse:  97  82  Temp:  98.2 F (36.8 C)  98.1 F (36.7 C)  TempSrc:      Resp: 18 17 17 17   Weight:      SpO2: 98% 96% 96% 97%    Neurologically intact Neurovascular intact Sensation intact distally Intact pulses distally Dorsiflexion/Plantar flexion intact Incision: dressing C/D/I and no drainage No cellulitis present Compartment soft   Lab Results  Component Value Date   WBC 8.0 11/06/2013   HGB 11.7* 11/06/2013   HCT 33.9* 11/06/2013   MCV 86.5 11/06/2013   PLT 218 11/06/2013     Assessment/Plan:  1 Day Post-Op   - Expected postop acute blood loss anemia - will monitor for symptoms - Up with PT/OT - DVT ppx - SCDs, ambulation, asa - WBAT left lower extremity - remove foley  - Pain control - Discharge planning  Cheral AlmasXu, [place] Michael 11/06/2013, 7:37 AM [phone]

## 2013-11-06 NOTE — Progress Notes (Signed)
Patient transferred to 5N, report given to receiving nurse.

## 2013-11-06 NOTE — Care Management Note (Signed)
  Page 1 of 1   11/06/2013     3:18:06 PM CARE MANAGEMENT NOTE 11/06/2013  Patient:  Kevin Jennings,Kevin Jennings   Account Number:  [id number]  Date Initiated:  11/06/2013  Documentation initiated by:  Ronny FlurryWILE,Pandora Mccrackin  Subjective/Objective Assessment:     Action/Plan:   Anticipated DC Date:  11/07/2013   Anticipated DC Plan:  HOME W HOME HEALTH SERVICES         Choice offered to / List presented to:  C-1 Patient        HH arranged  HH-2 PT      Status of service:   Medicare Important Message given?  YES (If response is "NO", the following Medicare IM given date fields will be blank) Date Medicare IM given:  11/06/2013 Medicare IM given by:  Ronny FlurryWILE,Shaquasia Caponigro Date Additional Medicare IM given:   Additional Medicare IM given by:    Discharge Disposition:    Per UR Regulation:    If discussed at Long Length of Stay Meetings, dates discussed:    Comments: 11-06-13 [facility] . Ronny FlurryHeather Anthonia Monger RN BSN       11-06-13 Confirmed face sheet information with patient . Patient already has DME , he would like [facility] for HHPT. Called Corrie DandyMary with [facility] , awaiting call back to confirm [facility] . Ronny FlurryHeather Bular Hickok RN BSN

## 2013-11-06 NOTE — Progress Notes (Signed)
Occupational Therapy Evaluation Patient Details Name: Kevin Jennings Jorgenson Jr. MRN: [id number] DOB: [birth year] Today's Date: 11/06/2013    History of Present Illness Kevin Jennings Worster Jr. is a 48 y.o. male s/p Lt direct anterior THA.  PMH of HTN, DM, peripheral neuropathy, and arthritis.    Clinical Impression   PTA pt lived at home and had the assistance of a home health aide for ADLs and homemaking. Pt ambulated with a cane. He is currently limited by pain and limited ROM from surgery, however moving well. Pt will benefit from acute OT to address safe tub transfer technique and LB ADLs.     Follow Up Recommendations  No OT follow up;Supervision/Assistance - 24 hour    Equipment Recommendations  None recommended by OT    Recommendations for Other Services       Precautions / Restrictions Precautions Precautions: Fall Restrictions Weight Bearing Restrictions: No      Mobility Bed Mobility Overal bed mobility: Needs Assistance Bed Mobility: Supine to Sit     Supine to sit: Min assist;HOB elevated     General bed mobility comments: Min (A) to advance LLE to EOB. Use of bedrails. Pt able to scoot hips out to EOB independently.   Transfers Overall transfer level: Needs assistance Equipment used: Rolling walker (2 wheeled) Transfers: Sit to/from Stand Sit to Stand: Min guard         General transfer comment: Min guard for safety due to quick transition. Pt rocks forward to gain momentum.         ADL Overall ADL's : Needs assistance/impaired Eating/Feeding: Independent;Sitting   Grooming: Oral care;Set up;Sitting   Upper Body Bathing: Set up;Sitting   Lower Body Bathing: Minimal assistance;Sit to/from stand   Upper Body Dressing : Set up;Sitting   Lower Body Dressing: Moderate assistance;Sit to/from stand   Toilet Transfer: Min guard;Stand-pivot;RW (bed>recliner)                   Vision  No apparent deficits.                    Perception  Perception Perception Tested?: No   Praxis Praxis Praxis tested?: Within functional limits    Pertinent Vitals/Pain Pain Assessment: 0-10 Pain Score: 4  Pain Location: Lt hip Pain Descriptors / Indicators: Burning Pain Intervention(s): Limited activity within patient's tolerance;Monitored during session;Repositioned     Hand Dominance Right   Extremity/Trunk Assessment Upper Extremity Assessment Upper Extremity Assessment: Overall WFL for tasks assessed   Lower Extremity Assessment Lower Extremity Assessment: Defer to PT evaluation   Cervical / Trunk Assessment Cervical / Trunk Assessment: Normal   Communication Communication Communication: No difficulties   Cognition Arousal/Alertness: Awake/alert Behavior During Therapy: WFL for tasks assessed/performed Overall Cognitive Status: Within Functional Limits for tasks assessed                                Home Living Family/patient expects to be discharged to:: Private residence Living Arrangements: Alone Available Help at Discharge: Personal care attendant;Family;Available PRN/intermittently (aide for 3 hrs a day/ 7 days a week) Type of Home: Apartment Home Access: Level entry (at back door)     Home Layout: One level     Bathroom Shower/Tub: Tub/shower unit Shower/tub characteristics: Engineer, building servicesCurtain Bathroom Toilet: Standard     Home Equipment: Bedside commode;Cane - single point;Wheelchair - power;Walker - 2 wheels          Prior Functioning/Environment  Level of Independence: Needs assistance  Gait / Transfers Assistance Needed: Pt uses cane for ambulation.  ADL's / Homemaking Assistance Needed: Pt has HH aide to assist with ADLs and homemaking.        OT Diagnosis: Generalized weakness;Acute pain   OT Problem List: Decreased strength;Decreased range of motion;Decreased activity tolerance;Impaired balance (sitting and/or standing);Decreased safety awareness;Decreased knowledge of use of DME or  AE;Decreased knowledge of precautions;Pain   OT Treatment/Interventions: Self-care/ADL training;Therapeutic exercise;Energy conservation;DME and/or AE instruction;Therapeutic activities;Patient/family education;Balance training    OT Goals(Current goals can be found in the care plan section) Acute Rehab OT Goals Patient Stated Goal: to get the help I need OT Goal Formulation: With patient Time For Goal Achievement: 11/20/13 Potential to Achieve Goals: Good ADL Goals Pt Will Perform Grooming: with supervision;standing Pt Will Perform Lower Body Bathing: with supervision;sit to/from stand;with set-up Pt Will Perform Lower Body Dressing: with supervision;sit to/from stand;with set-up Pt Will Transfer to Toilet: with supervision;ambulating;bedside commode Pt Will Perform Toileting - Clothing Manipulation and hygiene: with supervision;sit to/from stand Pt Will Perform Tub/Shower Transfer: Tub transfer;with min guard assist;ambulating;3 in 1;rolling walker  OT Frequency: Min 2X/week   Barriers to D/C: Decreased caregiver support  Pt lives alone but has aide for ~3 hours per day. Hopes to increase this amount at d/c.           End of Session Equipment Utilized During Treatment: Gait belt;Rolling walker  Activity Tolerance: Patient tolerated treatment well Patient left: in chair;with call bell/phone within reach   Time: 0959-1028 OT Time Calculation (min): 29 min Charges:  OT General Charges $OT Visit: 1 Procedure OT Evaluation $Initial OT Evaluation Tier I: 1 Procedure OT Treatments $Self Care/Home Management : 8-22 mins $Therapeutic Activity: 8-22 mins  Kevin Jennings, Kevin Jennings 11/06/2013, 10:50 AM  Carney LivingLeeAnn Marie Calirose Jennings, OTR/L Occupational Therapist [phone] (pager)

## 2013-11-06 NOTE — Progress Notes (Signed)
Physical Therapy Treatment Patient Details Name: Kevin FaceJoshua Tomasello Jr. MRN: [id number] DOB: 12-Dec-1965 Today's Date: 11/06/2013    History of Present Illness Kevin FaceJoshua Faries Jr. is a 48 y.o. male s/p Lt direct anterior THA on 11/05/13.  PMH of HTN, DM, peripheral neuropathy, and arthritis.     PT Comments    Pt is progressing well with mobility.  He is stiff and sore for this afternoon's treatment, but still able to progress gait further down the hallway than this morning.  Pt continues to be appropriate for HHPT at discharge and has all equipment needed at home.   Follow Up Recommendations  Home health PT;Supervision - Intermittent     Equipment Recommendations  None recommended by PT    Recommendations for Other Services   NA     Precautions / Restrictions Precautions Precautions: Fall Precaution Comments: pt is mildly impulsive with abnormal gait pattern    Mobility  Bed Mobility Overal bed mobility: Needs Assistance Bed Mobility: Sit to Supine       Sit to supine: Mod assist   General bed mobility comments: Mod assist to lift bil legs into the bed.   Transfers Overall transfer level: Needs assistance Equipment used: Rolling walker (2 wheeled) Transfers: Sit to/from Stand Sit to Stand: Min guard         General transfer comment: Min guard assit for safety due to slow speed of transitions and heavy reliance on arms for support.    Ambulation/Gait Ambulation/Gait assistance: Min guard Ambulation Distance (Feet): 180 Feet Assistive device: Rolling walker (2 wheeled) Gait Pattern/deviations: Step-through pattern;Antalgic;Trunk flexed Gait velocity: decreased Gait velocity interpretation: Below normal speed for age/gender General Gait Details: Pt has continued antalgic gait pattern.  Verbal cues for upright posture and attempts at heel to toe gait.           Balance Overall balance assessment: Needs assistance Sitting-balance support: Feet supported;No upper  extremity supported Sitting balance-Leahy Scale: Good     Standing balance support: Bilateral upper extremity supported;No upper extremity supported;Single extremity supported Standing balance-Leahy Scale: Fair Standing balance comment: Pt attempting to do short distance gait without RW around room, in bathroom and remains generally unsteady.                      Cognition Arousal/Alertness: Awake/alert Behavior During Therapy: Impulsive (mildly) Overall Cognitive Status: Within Functional Limits for tasks assessed                      Exercises Total Joint Exercises Ankle Circles/Pumps: AROM;Both;10 reps;Seated Quad Sets: AROM;Left;10 reps;Seated Short Arc Quad: AROM;Left;10 reps;Seated Heel Slides: AAROM;Left;10 reps;Seated Hip ABduction/ADduction: AAROM;Left;10 reps;Seated        Pertinent Vitals/Pain Pain Assessment: 0-10 Pain Score: 9  Pain Location: left hip Pain Descriptors / Indicators: Burning Pain Intervention(s): Limited activity within patient's tolerance;Monitored during session;Repositioned;Patient requesting pain meds-RN notified;Ice applied     PT Goals (current goals can now be found in the care plan section) Acute Rehab PT Goals Patient Stated Goal: to get his hip better and go home Progress towards PT goals: Progressing toward goals    Frequency  7X/week    PT Plan Current plan remains appropriate       End of Session   Activity Tolerance: Patient limited by fatigue;Patient limited by pain Patient left: in bed;with call bell/phone within reach     Time: 1325-1345 PT Time Calculation (min): 20 min  Charges:  $Gait Training: 8-22 mins  Rollene Rotundaebecca B. Niyam Bisping, PT, DPT [phone]   11/06/2013, 4:09 PM

## 2013-11-06 NOTE — Evaluation (Signed)
Physical Therapy Evaluation Patient Details Name: Kevin FaceJoshua Vessels Jr. MRN: [id number] DOB: [birth year] Today's Date: 11/06/2013   History of Present Illness  Kevin FaceJoshua Gelpi Jr. is a 48 y.o. male s/p Lt direct anterior THA on 11/05/13.  PMH of HTN, DM, peripheral neuropathy, and arthritis.   Clinical Impression  Pt is POD #1 and is mobilizing at min guard and min assist with RW.  Pt was able to complete his hip exercises and I anticipate he will progress well enough to d/c home with HHPT at discharge.  He had all equipment needed form a PT standpoint PTA.   PT to follow acutely for deficits listed below.       Follow Up Recommendations Home health PT;Supervision - Intermittent    Equipment Recommendations  None recommended by PT    Recommendations for Other Services   NA    Precautions / Restrictions Precautions Precautions: Fall Precaution Comments: pt is mildly impulsive with abnormal gait pattern Restrictions Weight Bearing Restrictions: No      Mobility  Bed Mobility Overal bed mobility: Needs Assistance Bed Mobility: Supine to Sit     Supine to sit: Min assist;HOB elevated     General bed mobility comments: Min (A) to advance LLE to EOB. Use of bedrails. Pt able to scoot hips out to EOB independently.   Transfers Overall transfer level: Needs assistance Equipment used: Rolling walker (2 wheeled) Transfers: Sit to/from Stand Sit to Stand: Min guard         General transfer comment: Min guard assist for safety.  Verbal cues for hand placement and slower speed of transitions.   Ambulation/Gait Ambulation/Gait assistance: Min guard Ambulation Distance (Feet): 100 Feet Assistive device: Rolling walker (2 wheeled) Gait Pattern/deviations: Step-through pattern;Antalgic   Gait velocity interpretation: at or above normal speed for age/gender General Gait Details: Pt with antalgic gait pattern, verbal cues for upright posture and more normal heel to toe gait pattern.           Balance Overall balance assessment: Needs assistance Sitting-balance support: Feet supported;No upper extremity supported Sitting balance-Leahy Scale: Good     Standing balance support: Bilateral upper extremity supported;No upper extremity supported Standing balance-Leahy Scale: Fair Standing balance comment: pt able to maintain static standing without upper extremity support.                              Pertinent Vitals/Pain Pain Assessment: 0-10 Pain Score: 8  Pain Location: left hip Pain Descriptors / Indicators: Burning Pain Intervention(s): Limited activity within patient's tolerance;Monitored during session;Repositioned    Home Living Family/patient expects to be discharged to:: Private residence Living Arrangements: Alone Available Help at Discharge: Personal care attendant;Family;Available PRN/intermittently (aide for 3 hrs a day/ 7 days a week) Type of Home: Apartment Home Access: Level entry (at back door)     Home Layout: One level Home Equipment: Bedside commode;Cane - single point;Wheelchair - power;Walker - 2 wheels      Prior Function Level of Independence: Needs assistance   Gait / Transfers Assistance Needed: Pt uses cane for ambulation.   ADL's / Homemaking Assistance Needed: Pt has HH aide to assist with ADLs and homemaking.        Hand Dominance   Dominant Hand: Right    Extremity/Trunk Assessment   Upper Extremity Assessment: Defer to OT evaluation           Lower Extremity Assessment: LLE deficits/detail   LLE Deficits / Details:  left leg with normal post op pain and weakness.  Ankle 3/5, knee 3-/5, hip 2+/5  Cervical / Trunk Assessment: Normal  Communication   Communication: No difficulties  Cognition Arousal/Alertness: Awake/alert Behavior During Therapy: Impulsive (mildly) Overall Cognitive Status: Within Functional Limits for tasks assessed                         Exercises Total Joint  Exercises Ankle Circles/Pumps: AROM;Both;10 reps;Seated Quad Sets: AROM;Left;10 reps;Seated Short Arc Quad: AROM;Left;10 reps;Seated Heel Slides: AAROM;Left;10 reps;Seated Hip ABduction/ADduction: AAROM;Left;10 reps;Seated      Assessment/Plan    PT Assessment Patient needs continued PT services  PT Diagnosis Difficulty walking;Abnormality of gait;Generalized weakness;Acute pain   PT Problem List Decreased strength;Decreased range of motion;Decreased activity tolerance;Decreased balance;Decreased mobility;Decreased knowledge of use of DME;Pain  PT Treatment Interventions DME instruction;Gait training;Functional mobility training;Therapeutic activities;Therapeutic exercise;Balance training;Neuromuscular re-education;Patient/family education;Modalities;Manual techniques   PT Goals (Current goals can be found in the Care Plan section) Acute Rehab PT Goals Patient Stated Goal: to get his hip better and go home PT Goal Formulation: With patient Time For Goal Achievement: 11/13/13 Potential to Achieve Goals: Good    Frequency 7X/week    End of Session   Activity Tolerance: Patient limited by fatigue;Patient limited by pain Patient left: in chair;with call bell/phone within reach           Time: 1100-1115 PT Time Calculation (min): 15 min   Charges:   PT Evaluation $Initial PT Evaluation Tier I: 1 Procedure PT Treatments $Gait Training: 8-22 mins        Rieley Khalsa B. Liisa Picone, PT, DPT [phone]   11/06/2013, 11:22 AM

## 2013-11-07 LAB — CBC
HEMATOCRIT: 30.9 % — AB (ref 39.0–52.0)
Hemoglobin: 10.5 g/dL — ABNORMAL LOW (ref 13.0–17.0)
MCH: 29.7 pg (ref 26.0–34.0)
MCHC: 34 g/dL (ref 30.0–36.0)
MCV: 87.3 fL (ref 78.0–100.0)
Platelets: 176 10*3/uL (ref 150–400)
RBC: 3.54 MIL/uL — ABNORMAL LOW (ref 4.22–5.81)
RDW: 15.8 % — AB (ref 11.5–15.5)
WBC: 7 10*3/uL (ref 4.0–10.5)

## 2013-11-07 LAB — GLUCOSE, CAPILLARY: GLUCOSE-CAPILLARY: 99 mg/dL (ref 70–99)
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
## 2013-11-07 NOTE — Care Management Note (Signed)
CARE MANAGEMENT NOTE 11/07/2013  Patient:  Loleta BooksJONES,Json   Account Number:  [id number]  Date Initiated:  11/06/2013  Documentation initiated by:  Ginger OrganWILE,HEATHER  Subjective/Objective Assessment:   48 yr old male admitted with left hip osteoarthritis, underwent a left total hip arthroplasty.     Action/Plan:   10/16- Case manager  requested RW for patient. States he has all other DME. Has support at discharge.   Anticipated DC Date:  11/07/2013   Anticipated DC Plan:  HOME W HOME HEALTH SERVICES      DC Planning Services  CM consult      [facility]  DURABLE MEDICAL EQUIPMENT   Choice offered to / List presented to:  C-1 Patient   DME arranged  [facility]      DME agency  [facility]     [facility]  [facility]      Status of service:  Completed, signed off Medicare Important Message given?  YES (If response is "NO", the following Medicare IM given date fields will be blank) Date Medicare IM given:  11/06/2013 Medicare IM given by:  Ronny FlurryWILE,HEATHER Date Additional Medicare IM given:   Additional Medicare IM given by:    Discharge Disposition:  HOME W HOME HEALTH SERVICES  Per UR Regulation:  Reviewed for med. necessity/level of care/duration of stay  If discussed at Long Length of Stay Meetings, dates discussed:

## 2013-11-07 NOTE — Progress Notes (Signed)
Patient stated that his ride was coming to the hospital at 9am to take him home. He said he would not wait for his PT session. The therapist was paged to make aware. The last CM note states that Home Health may still be pending confirmation. It was explained to the patient that he must stay long enough to speak with CM prior to leaving.

## 2013-11-07 NOTE — Progress Notes (Signed)
   Subjective:  Patient reports pain as mild.  No events.  Objective:   VITALS:   Filed Vitals:   11/06/13 2000 11/06/13 2041 11/07/13 0000 11/07/13 0542  BP:  126/76  122/74  Pulse:  81  78  Temp:  97.7 F (36.5 C)  98.2 F (36.8 C)  TempSrc:  Oral  Oral  Resp: 17  18   Height:      Weight:      SpO2: 96% 96%  95%    Incision c/d/i   Lab Results  Component Value Date   WBC 7.0 11/07/2013   HGB 10.5* 11/07/2013   HCT 30.9* 11/07/2013   MCV 87.3 11/07/2013   PLT 176 11/07/2013     Assessment/Plan:  2 Days Post-Op   - Expected postop acute blood loss anemia - will monitor for symptoms - Up with PT/OT - HHPT - DVT ppx - SCDs, ambulation, asa - dressing changed - WBAT left lower extremity - Pain control - Discharge planning - home today  Cheral AlmasXu, Naiping Michael 11/07/2013, 7:38 AM [phone]

## 2013-11-11 ENCOUNTER — Encounter (HOSPITAL_COMMUNITY): Payer: Self-pay | Admitting: Orthopaedic Surgery

## 2013-12-09 ENCOUNTER — Encounter: Payer: Self-pay | Admitting: Orthopaedic Surgery

## 2013-12-23 ENCOUNTER — Encounter: Payer: Self-pay | Admitting: Orthopaedic Surgery

## 2014-02-03 ENCOUNTER — Other Ambulatory Visit (HOSPITAL_COMMUNITY): Payer: Self-pay | Admitting: Orthopaedic Surgery

## 2014-02-05 ENCOUNTER — Encounter (HOSPITAL_COMMUNITY): Payer: Self-pay | Admitting: Orthopaedic Surgery

## 2014-02-05 NOTE — Pre-Procedure Instructions (Signed)
Kevin AgarJoshua J Chao Jr.  02/05/2014   Your procedure is scheduled on:  Wednesday, February 18, 2014  [facility]30 [facility].  Call this number if you have problems the morning of surgery: [phone]   Remember:   Do not eat food or drink liquids after midnight Tuesday, February 17, 2014   Take these medicines the morning of surgery with A SIP OF WATER: amLODipine (NORVASC)  fluticasone (FLONASE)  nasal spray, Olopatadine HCl (PATADAY) eye drops, if needed: oxyCODONE for pain  DO NOT TAKE ANY DIABETIC MEDICATION THE MORNING OF PROCEDURE ( metFORMIN (GLUCOPHAGE)  Stop taking Aspirin, vitamins, and herbal medications. Do not take any NSAIDs ie: Ibuprofen, Advil, Naproxen or any medication containing Aspirin; stop 1 week prior to procedure ( Wednesday, February 11, 2014)  Do not wear jewelry, make-up or nail polish  Do not wear lotions, powders, or perfumes. You may not wear deodorant.  Do not shave 48 hours prior to surgery. Men may shave face and neck.  Do not bring valuables to the hospital.  [facility] not responsible for any belongings or valuables.               Contacts, dentures or bridgework may not be worn into surgery.  Leave suitcase in the car. After surgery it may be brought to your room.  For patients admitted to the hospital, discharge time is determined by your treatment team.               Patients discharged the day of surgery will not be allowed to drive home.  Name and phone number of your driver:   Special Instructions:  Special Instructions:Special Instructions: [facility] Pre[facility]  Before surgery, you can play an important role.  Because skin is not sterile, your skin needs to be as free of germs as possible.  You can reduce the number of germs on you skin by washing with CHG (chlorahexidine gluconate) soap before surgery.  CHG is an antiseptic cleaner which kills germs and bonds with the skin to continue killing germs  even after washing.  Please DO NOT use if you have an allergy to CHG or antibacterial soaps.  If your skin becomes reddened/irritated stop using the CHG and inform your nurse when you arrive at Short Stay.  Do not shave (including legs and underarms) for at least 48 hours prior to the first CHG shower.  You may shave your face.  Please follow these instructions carefully:   1.  Shower with CHG Soap the night before surgery and the morning of Surgery.  2.  If you choose to wash your hair, wash your hair first as usual with your normal shampoo.  3.  After you shampoo, rinse your hair and body thoroughly to remove the Shampoo.  4.  Use CHG as you would any other liquid soap.  You can apply chg directly  to the skin and wash gently with scrungie or a clean washcloth.  5.  Apply the CHG Soap to your body ONLY FROM THE NECK DOWN.  Do not use on open wounds or open sores.  Avoid contact with your eyes, ears, mouth and genitals (private parts).  Wash genitals (private parts) with your normal soap.  6.  Wash thoroughly, paying special attention to the area where your surgery will be performed.  7.  Thoroughly rinse your body with warm water from the neck down.  8.  DO NOT shower/wash with  your normal soap after using and rinsing off the CHG Soap.  9.  Pat yourself dry with a clean towel.            10.  Wear clean pajamas.            11.  Place clean sheets on your bed the night of your first shower and do not sleep with pets.  Day of Surgery  Do not apply any lotions/deodorants the morning of surgery.  Please wear clean clothes to the hospital/surgery center.   Please read over the following fact sheets that you were given: Pain Booklet, Coughing and Deep Breathing, Blood Transfusion Information, Total Joint Packet, MRSA Information and Surgical Site Infection Prevention

## 2014-02-06 ENCOUNTER — Encounter (HOSPITAL_COMMUNITY)
Admission: RE | Admit: 2014-02-06 | Discharge: 2014-02-06 | Disposition: A | Discharge status: Home / Self Care | Payer: Medicare Other | Source: Ambulatory Visit | Attending: Orthopaedic Surgery | Admitting: Orthopaedic Surgery

## 2014-02-06 ENCOUNTER — Encounter (HOSPITAL_COMMUNITY): Payer: Self-pay

## 2014-02-06 DIAGNOSIS — Z01812 Encounter for preprocedural laboratory examination: Secondary | ICD-10-CM | POA: Diagnosis not present

## 2014-02-06 DIAGNOSIS — M1611 Unilateral primary osteoarthritis, right hip: Secondary | ICD-10-CM | POA: Diagnosis present

## 2014-02-06 LAB — CBC WITH DIFFERENTIAL/PLATELET
BASOS PCT: 0 % (ref 0–1)
Basophils Absolute: 0 10*3/uL (ref 0.0–0.1)
Eosinophils Absolute: 0.1 10*3/uL (ref 0.0–0.7)
Eosinophils Relative: 1 % (ref 0–5)
HCT: 42.3 % (ref 39.0–52.0)
Hemoglobin: 14.4 g/dL (ref 13.0–17.0)
LYMPHS PCT: 31 % (ref 12–46)
Lymphs Abs: 2.8 10*3/uL (ref 0.7–4.0)
MCH: 29.1 pg (ref 26.0–34.0)
MCHC: 34 g/dL (ref 30.0–36.0)
MCV: 85.6 fL (ref 78.0–100.0)
MONO ABS: 0.8 10*3/uL (ref 0.1–1.0)
Monocytes Relative: 9 % (ref 3–12)
Neutro Abs: 5.5 10*3/uL (ref 1.7–7.7)
Neutrophils Relative %: 59 % (ref 43–77)
PLATELETS: 287 10*3/uL (ref 150–400)
RBC: 4.94 MIL/uL (ref 4.22–5.81)
RDW: 16.5 % — ABNORMAL HIGH (ref 11.5–15.5)
WBC: 9.2 10*3/uL (ref 4.0–10.5)

## 2014-02-06 LAB — COMPREHENSIVE METABOLIC PANEL
ALT: 34 U/L (ref 0–53)
ANION GAP: 13 (ref 5–15)
AST: 32 U/L (ref 0–37)
Albumin: 4.6 g/dL (ref 3.5–5.2)
Alkaline Phosphatase: 73 U/L (ref 39–117)
BILIRUBIN TOTAL: 1.1 mg/dL (ref 0.3–1.2)
BUN: 15 mg/dL (ref 6–23)
CO2: 19 mmol/L (ref 19–32)
CREATININE: 1.26 mg/dL (ref 0.50–1.35)
Calcium: 9.7 mg/dL (ref 8.4–10.5)
Chloride: 107 mEq/L (ref 96–112)
GFR, EST AFRICAN AMERICAN: 76 mL/min — AB (ref >90)
GFR, EST NON AFRICAN AMERICAN: 66 mL/min — AB (ref >90)
GLUCOSE: 111 mg/dL — AB (ref 70–99)
POTASSIUM: 3.5 mmol/L (ref 3.5–5.1)
Sodium: 139 mmol/L (ref 135–145)
Total Protein: 7.9 g/dL (ref 6.0–8.3)

## 2014-02-06 LAB — C-REACTIVE PROTEIN: CRP: <0.5 mg/dL — ABNORMAL LOW (ref <0.60)

## 2014-02-06 LAB — URINALYSIS, ROUTINE W REFLEX MICROSCOPIC
Bilirubin Urine: NEGATIVE
Glucose, UA: NEGATIVE mg/dL
Hgb urine dipstick: NEGATIVE
Ketones, ur: NEGATIVE mg/dL
Leukocytes, UA: NEGATIVE
Nitrite: NEGATIVE
Protein, ur: NEGATIVE mg/dL
Specific Gravity, Urine: 1.019 (ref 1.005–1.030)
Urobilinogen, UA: 0.2 mg/dL (ref 0.0–1.0)
pH: 5.5 (ref 5.0–8.0)

## 2014-02-06 LAB — PROTIME-INR
INR: 1.05 (ref 0.00–1.49)
Prothrombin Time: 13.8 seconds (ref 11.6–15.2)

## 2014-02-06 LAB — APTT: APTT: 31 s (ref 24–37)

## 2014-02-06 LAB — TYPE AND SCREEN
ABO/RH(D): O POS
Antibody Screen: NEGATIVE

## 2014-02-06 LAB — SURGICAL PCR SCREEN
MRSA, PCR: NEGATIVE
Staphylococcus aureus: NEGATIVE

## 2014-02-06 LAB — SEDIMENTATION RATE: Sed Rate: 7 mm/hr (ref 0–16)

## 2014-02-06 MED ORDER — CHLORHEXIDINE GLUCONATE 4 % EX LIQD: 60.0000 mL | Freq: Once | CUTANEOUS | Status: DC

## 2014-02-17 MED ORDER — BUPIVACAINE LIPOSOME 1.3 % IJ SUSP
20.0000 mL | Freq: Once | INTRAMUSCULAR | Status: DC
  Filled 2014-02-17: qty 20

## 2014-02-17 MED ORDER — CHLORHEXIDINE GLUCONATE 4 % EX LIQD
60.0000 mL | Freq: Once | CUTANEOUS | Status: DC
  Filled 2014-02-17: qty 60

## 2014-02-17 MED ORDER — DEXTROSE 5 % IV SOLN
3.0000 g | INTRAVENOUS | Status: AC
  Administered 2014-02-18: 3 g via INTRAVENOUS
  Filled 2014-02-17: qty 3000
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
## 2014-02-18 ENCOUNTER — Inpatient Hospital Stay (HOSPITAL_COMMUNITY): Payer: Medicare Other

## 2014-02-18 ENCOUNTER — Inpatient Hospital Stay (HOSPITAL_COMMUNITY): Payer: Medicare Other | Admitting: Anesthesiology

## 2014-02-18 ENCOUNTER — Encounter (HOSPITAL_COMMUNITY)
Admission: RE | Disposition: A | Discharge status: Home / Self Care | Payer: Self-pay | Source: Ambulatory Visit | Attending: Orthopaedic Surgery

## 2014-02-18 ENCOUNTER — Inpatient Hospital Stay (HOSPITAL_COMMUNITY)
Admission: RE | Admit: 2014-02-18 | Discharge: 2014-02-20 | DRG: 470 | Disposition: A | Discharge status: Home / Self Care | Payer: Medicare Other | Source: Ambulatory Visit | Attending: Orthopaedic Surgery | Admitting: Orthopaedic Surgery

## 2014-02-18 ENCOUNTER — Encounter (HOSPITAL_COMMUNITY): Payer: Self-pay | Admitting: *Deleted

## 2014-02-18 DIAGNOSIS — Z96649 Presence of unspecified artificial hip joint: Secondary | ICD-10-CM

## 2014-02-18 DIAGNOSIS — E119 Type 2 diabetes mellitus without complications: Secondary | ICD-10-CM | POA: Diagnosis present

## 2014-02-18 DIAGNOSIS — Z8042 Family history of malignant neoplasm of prostate: Secondary | ICD-10-CM

## 2014-02-18 DIAGNOSIS — Z419 Encounter for procedure for purposes other than remedying health state, unspecified: Secondary | ICD-10-CM

## 2014-02-18 DIAGNOSIS — E785 Hyperlipidemia, unspecified: Secondary | ICD-10-CM | POA: Diagnosis present

## 2014-02-18 DIAGNOSIS — I10 Essential (primary) hypertension: Secondary | ICD-10-CM | POA: Diagnosis present

## 2014-02-18 DIAGNOSIS — F1290 Cannabis use, unspecified, uncomplicated: Secondary | ICD-10-CM | POA: Diagnosis present

## 2014-02-18 DIAGNOSIS — Z79899 Other long term (current) drug therapy: Secondary | ICD-10-CM | POA: Diagnosis not present

## 2014-02-18 DIAGNOSIS — M1611 Unilateral primary osteoarthritis, right hip: Secondary | ICD-10-CM | POA: Diagnosis present

## 2014-02-18 DIAGNOSIS — Z96642 Presence of left artificial hip joint: Secondary | ICD-10-CM | POA: Diagnosis present

## 2014-02-18 DIAGNOSIS — M1610 Unilateral primary osteoarthritis, unspecified hip: Secondary | ICD-10-CM | POA: Diagnosis present

## 2014-02-18 DIAGNOSIS — Z7982 Long term (current) use of aspirin: Secondary | ICD-10-CM | POA: Diagnosis not present

## 2014-02-18 DIAGNOSIS — Z888 Allergy status to other drugs, medicaments and biological substances status: Secondary | ICD-10-CM | POA: Diagnosis not present

## 2014-02-18 HISTORY — PX: TOTAL HIP ARTHROPLASTY: SHX124

## 2014-02-18 LAB — GLUCOSE, CAPILLARY
GLUCOSE-CAPILLARY: 116 mg/dL — AB (ref 70–99)
Glucose-Capillary: 89 mg/dL (ref 70–99)
Glucose-Capillary: 78 mg/dL (ref 70–99)
Glucose-Capillary: 118 mg/dL — ABNORMAL HIGH (ref 70–99)

## 2014-02-18 SURGERY — ARTHROPLASTY, HIP, TOTAL, ANTERIOR APPROACH
Anesthesia: General | Site: Hip | Laterality: Right

## 2014-02-18 MED ORDER — GLYCOPYRROLATE 0.2 MG/ML IJ SOLN
INTRAMUSCULAR | Status: DC | PRN
  Administered 2014-02-18: 0.6 mg via INTRAVENOUS

## 2014-02-18 MED ORDER — NEOSTIGMINE METHYLSULFATE 10 MG/10ML IV SOLN
INTRAVENOUS | Status: AC
  Filled 2014-02-18: qty 1

## 2014-02-18 MED ORDER — ROCURONIUM BROMIDE 100 MG/10ML IV SOLN
INTRAVENOUS | Status: DC | PRN
  Administered 2014-02-18: 20 mg via INTRAVENOUS
  Administered 2014-02-18: 50 mg via INTRAVENOUS
  Administered 2014-02-18: 10 mg via INTRAVENOUS
  Administered 2014-02-18: 20 mg via INTRAVENOUS

## 2014-02-18 MED ORDER — MAGNESIUM CITRATE PO SOLN
1.0000 | Freq: Once | ORAL | Status: AC | PRN
  Filled 2014-02-18: qty 296

## 2014-02-18 MED ORDER — TRANEXAMIC ACID 100 MG/ML IV SOLN
1000.0000 mg | INTRAVENOUS | Status: AC
  Filled 2014-02-18: qty 10

## 2014-02-18 MED ORDER — KETOROLAC TROMETHAMINE 30 MG/ML IJ SOLN
INTRAMUSCULAR | Status: AC
  Administered 2014-02-18: 30 mg via INTRAVENOUS
  Filled 2014-02-18: qty 1

## 2014-02-18 MED ORDER — NEOSTIGMINE METHYLSULFATE 10 MG/10ML IV SOLN
INTRAVENOUS | Status: DC | PRN
  Administered 2014-02-18: 4 mg via INTRAVENOUS

## 2014-02-18 MED ORDER — ONDANSETRON HCL 4 MG/2ML IJ SOLN
INTRAMUSCULAR | Status: DC | PRN
  Administered 2014-02-18: 4 mg via INTRAVENOUS

## 2014-02-18 MED ORDER — FENTANYL CITRATE 0.05 MG/ML IJ SOLN
INTRAMUSCULAR | Status: AC
  Filled 2014-02-18: qty 5

## 2014-02-18 MED ORDER — INSULIN ASPART 100 UNIT/ML ~~LOC~~ SOLN
0.0000 [IU] | Freq: Three times a day (TID) | SUBCUTANEOUS | Status: DC
Start: 2014-02-18 — End: 2014-02-20
  Administered 2014-02-19: 2 [IU] via SUBCUTANEOUS

## 2014-02-18 MED ORDER — ENALAPRIL MALEATE 20 MG PO TABS
20.0000 mg | ORAL_TABLET | Freq: Every morning | ORAL | Status: DC
  Filled 2014-02-18: qty 1

## 2014-02-18 MED ORDER — PROPOFOL 10 MG/ML IV BOLUS
INTRAVENOUS | Status: DC | PRN
  Administered 2014-02-18: 200 mg via INTRAVENOUS

## 2014-02-18 MED ORDER — SODIUM CHLORIDE 0.9 % IJ SOLN
INTRAMUSCULAR | Status: DC | PRN
  Administered 2014-02-18: 40 mL

## 2014-02-18 MED ORDER — HYDROMORPHONE HCL 1 MG/ML IJ SOLN
0.5000 mg | INTRAMUSCULAR | Status: AC | PRN
  Administered 2014-02-18 (×4): 0.5 mg via INTRAVENOUS

## 2014-02-18 MED ORDER — PHENOL 1.4 % MT LIQD: 1.0000 | OROMUCOSAL | Status: DC | PRN

## 2014-02-18 MED ORDER — ROCURONIUM BROMIDE 50 MG/5ML IV SOLN
INTRAVENOUS | Status: AC
  Filled 2014-02-18: qty 1

## 2014-02-18 MED ORDER — GLYCOPYRROLATE 0.2 MG/ML IJ SOLN
INTRAMUSCULAR | Status: AC
Start: 2014-02-18 — End: 2014-02-18
  Filled 2014-02-18: qty 2

## 2014-02-18 MED ORDER — PROMETHAZINE HCL 25 MG/ML IJ SOLN
INTRAMUSCULAR | Status: AC
  Filled 2014-02-18: qty 1

## 2014-02-18 MED ORDER — FENTANYL CITRATE 0.05 MG/ML IJ SOLN
INTRAMUSCULAR | Status: AC
  Administered 2014-02-18: 50 ug via INTRAVENOUS
  Filled 2014-02-18: qty 2

## 2014-02-18 MED ORDER — PHENYLEPHRINE HCL 10 MG/ML IJ SOLN
INTRAMUSCULAR | Status: DC | PRN
  Administered 2014-02-18: 80 ug via INTRAVENOUS
  Administered 2014-02-18: 120 ug via INTRAVENOUS
  Administered 2014-02-18: 80 ug via INTRAVENOUS

## 2014-02-18 MED ORDER — ONDANSETRON HCL 4 MG PO TABS: 4.0000 mg | ORAL_TABLET | Freq: Four times a day (QID) | ORAL | Status: DC | PRN

## 2014-02-18 MED ORDER — ONDANSETRON HCL 4 MG/2ML IJ SOLN: 4.0000 mg | Freq: Four times a day (QID) | INTRAMUSCULAR | Status: DC | PRN

## 2014-02-18 MED ORDER — MEPERIDINE HCL 25 MG/ML IJ SOLN: 6.2500 mg | INTRAMUSCULAR | Status: DC | PRN

## 2014-02-18 MED ORDER — ACETAMINOPHEN 325 MG PO TABS
650.0000 mg | ORAL_TABLET | Freq: Four times a day (QID) | ORAL | Status: DC | PRN
  Administered 2014-02-18 – 2014-02-19 (×2): 650 mg via ORAL
  Filled 2014-02-18 (×2): qty 2

## 2014-02-18 MED ORDER — FENTANYL CITRATE 0.05 MG/ML IJ SOLN
25.0000 ug | INTRAMUSCULAR | Status: DC | PRN
  Administered 2014-02-18 (×2): 50 ug via INTRAVENOUS

## 2014-02-18 MED ORDER — AMLODIPINE BESYLATE 10 MG PO TABS
10.0000 mg | ORAL_TABLET | Freq: Every morning | ORAL | Status: DC
  Administered 2014-02-19: 10 mg via ORAL
  Filled 2014-02-18 (×2): qty 1

## 2014-02-18 MED ORDER — ACETAMINOPHEN 650 MG RE SUPP
650.0000 mg | Freq: Four times a day (QID) | RECTAL | Status: DC | PRN
Start: 2014-02-18 — End: 2014-02-20

## 2014-02-18 MED ORDER — MIDAZOLAM HCL 2 MG/2ML IJ SOLN
INTRAMUSCULAR | Status: AC
  Filled 2014-02-18: qty 2

## 2014-02-18 MED ORDER — ONDANSETRON HCL 4 MG/2ML IJ SOLN
INTRAMUSCULAR | Status: AC
  Filled 2014-02-18: qty 2

## 2014-02-18 MED ORDER — TRAZODONE HCL 150 MG PO TABS
150.0000 mg | ORAL_TABLET | Freq: Every day | ORAL | Status: DC
  Administered 2014-02-18 – 2014-02-19 (×2): 150 mg via ORAL
  Filled 2014-02-18 (×3): qty 1

## 2014-02-18 MED ORDER — ACETAMINOPHEN 500 MG PO TABS
1000.0000 mg | ORAL_TABLET | Freq: Four times a day (QID) | ORAL | Status: AC
  Administered 2014-02-18 – 2014-02-19 (×3): 1000 mg via ORAL
  Filled 2014-02-18 (×2): qty 2

## 2014-02-18 MED ORDER — MENTHOL 3 MG MT LOZG: 1.0000 | LOZENGE | OROMUCOSAL | Status: DC | PRN

## 2014-02-18 MED ORDER — LIDOCAINE HCL (CARDIAC) 20 MG/ML IV SOLN
INTRAVENOUS | Status: DC | PRN
  Administered 2014-02-18: 100 mg via INTRAVENOUS

## 2014-02-18 MED ORDER — FLUTICASONE PROPIONATE 50 MCG/ACT NA SUSP
1.0000 | Freq: Every day | NASAL | Status: DC
  Administered 2014-02-19: 1 via NASAL
  Filled 2014-02-18 (×2): qty 16

## 2014-02-18 MED ORDER — DEXMEDETOMIDINE HCL 200 MCG/2ML IV SOLN
INTRAVENOUS | Status: DC | PRN
  Administered 2014-02-18 (×4): 12 ug via INTRAVENOUS

## 2014-02-18 MED ORDER — OXYCODONE HCL 5 MG PO TABS
5.0000 mg | ORAL_TABLET | ORAL | Status: DC | PRN
  Administered 2014-02-18 – 2014-02-20 (×3): 10 mg via ORAL
  Filled 2014-02-18 (×2): qty 2

## 2014-02-18 MED ORDER — ATORVASTATIN CALCIUM 20 MG PO TABS
20.0000 mg | ORAL_TABLET | Freq: Every day | ORAL | Status: DC
  Administered 2014-02-18 – 2014-02-19 (×2): 20 mg via ORAL
  Filled 2014-02-18 (×3): qty 1

## 2014-02-18 MED ORDER — 0.9 % SODIUM CHLORIDE (POUR BTL) OPTIME
TOPICAL | Status: DC | PRN
  Administered 2014-02-18: 1000 mL

## 2014-02-18 MED ORDER — DIPHENHYDRAMINE HCL (SLEEP) 25 MG PO TABS: 25.0000 mg | ORAL_TABLET | Freq: Every day | ORAL | Status: DC

## 2014-02-18 MED ORDER — ASPIRIN EC 325 MG PO TBEC: 325.0000 mg | DELAYED_RELEASE_TABLET | Freq: Two times a day (BID) | ORAL | Status: DC

## 2014-02-18 MED ORDER — DIPHENHYDRAMINE HCL 12.5 MG/5ML PO ELIX: 25.0000 mg | ORAL_SOLUTION | ORAL | Status: DC | PRN

## 2014-02-18 MED ORDER — HYDROMORPHONE HCL 1 MG/ML IJ SOLN
INTRAMUSCULAR | Status: AC
  Administered 2014-02-18: 0.5 mg via INTRAVENOUS
  Filled 2014-02-18: qty 2

## 2014-02-18 MED ORDER — SENNA 8.6 MG PO TABS
1.0000 | ORAL_TABLET | Freq: Two times a day (BID) | ORAL | Status: DC
  Administered 2014-02-18 – 2014-02-19 (×3): 8.6 mg via ORAL
  Filled 2014-02-18 (×6): qty 1

## 2014-02-18 MED ORDER — KETOROLAC TROMETHAMINE 30 MG/ML IJ SOLN
30.0000 mg | Freq: Four times a day (QID) | INTRAMUSCULAR | Status: DC | PRN
  Administered 2014-02-18: 30 mg via INTRAVENOUS

## 2014-02-18 MED ORDER — ALUM & MAG HYDROXIDE-SIMETH 200-200-20 MG/5ML PO SUSP: 30.0000 mL | ORAL | Status: DC | PRN

## 2014-02-18 MED ORDER — PROMETHAZINE HCL 25 MG/ML IJ SOLN: 6.2500 mg | INTRAMUSCULAR | Status: DC | PRN

## 2014-02-18 MED ORDER — DIPHENHYDRAMINE HCL 25 MG PO CAPS
25.0000 mg | ORAL_CAPSULE | Freq: Every day | ORAL | Status: DC
  Administered 2014-02-18 – 2014-02-19 (×2): 25 mg via ORAL
  Filled 2014-02-18 (×3): qty 1

## 2014-02-18 MED ORDER — OXYCODONE HCL 5 MG PO TABS
ORAL_TABLET | ORAL | Status: AC
  Administered 2014-02-18: 10 mg via ORAL
  Filled 2014-02-18: qty 2

## 2014-02-18 MED ORDER — MORPHINE SULFATE 2 MG/ML IJ SOLN
2.0000 mg | INTRAMUSCULAR | Status: DC | PRN
  Administered 2014-02-18: 2 mg via INTRAVENOUS
  Filled 2014-02-18: qty 1

## 2014-02-18 MED ORDER — HYDROCHLOROTHIAZIDE 25 MG PO TABS
25.0000 mg | ORAL_TABLET | Freq: Every morning | ORAL | Status: DC
  Filled 2014-02-18: qty 1

## 2014-02-18 MED ORDER — METOCLOPRAMIDE HCL 5 MG/ML IJ SOLN: 5.0000 mg | Freq: Three times a day (TID) | INTRAMUSCULAR | Status: DC | PRN

## 2014-02-18 MED ORDER — INSULIN ASPART 100 UNIT/ML ~~LOC~~ SOLN: 0.0000 [IU] | Freq: Every day | SUBCUTANEOUS | Status: DC

## 2014-02-18 MED ORDER — TRANEXAMIC ACID 100 MG/ML IV SOLN
1000.0000 mg | INTRAVENOUS | Status: AC
  Administered 2014-02-18 (×2): 1000 mg via INTRAVENOUS
  Filled 2014-02-18: qty 10

## 2014-02-18 MED ORDER — ALBUMIN HUMAN 5 % IV SOLN
INTRAVENOUS | Status: DC | PRN
  Administered 2014-02-18: 16:00:00 via INTRAVENOUS

## 2014-02-18 MED ORDER — LACTATED RINGERS IV SOLN
INTRAVENOUS | Status: DC
  Administered 2014-02-18: 10:00:00 via INTRAVENOUS

## 2014-02-18 MED ORDER — MIDAZOLAM HCL 5 MG/5ML IJ SOLN
INTRAMUSCULAR | Status: DC | PRN
  Administered 2014-02-18: 2 mg via INTRAVENOUS

## 2014-02-18 MED ORDER — PROPOFOL 10 MG/ML IV BOLUS
INTRAVENOUS | Status: AC
  Filled 2014-02-18: qty 20

## 2014-02-18 MED ORDER — SODIUM CHLORIDE 0.9 % IR SOLN
Status: DC | PRN
  Administered 2014-02-18: 3000 mL

## 2014-02-18 MED ORDER — PHENYLEPHRINE HCL 10 MG/ML IJ SOLN
10.0000 mg | INTRAMUSCULAR | Status: DC | PRN
Start: 2014-02-18 — End: 2014-02-18
  Administered 2014-02-18: 10 ug/min via INTRAVENOUS

## 2014-02-18 MED ORDER — ASPIRIN EC 325 MG PO TBEC
325.0000 mg | DELAYED_RELEASE_TABLET | Freq: Two times a day (BID) | ORAL | Status: DC
  Administered 2014-02-19 – 2014-02-20 (×3): 325 mg via ORAL
  Filled 2014-02-18 (×5): qty 1

## 2014-02-18 MED ORDER — GLYCOPYRROLATE 0.2 MG/ML IJ SOLN
INTRAMUSCULAR | Status: AC
  Filled 2014-02-18: qty 4

## 2014-02-18 MED ORDER — BUPIVACAINE LIPOSOME 1.3 % IJ SUSP
INTRAMUSCULAR | Status: DC | PRN
  Administered 2014-02-18: 20 mL

## 2014-02-18 MED ORDER — CEFAZOLIN SODIUM-DEXTROSE 2-3 GM-% IV SOLR
2.0000 g | Freq: Four times a day (QID) | INTRAVENOUS | Status: AC
  Administered 2014-02-18 – 2014-02-19 (×2): 2 g via INTRAVENOUS
  Filled 2014-02-18 (×2): qty 50

## 2014-02-18 MED ORDER — OXYCODONE HCL ER 10 MG PO T12A
10.0000 mg | EXTENDED_RELEASE_TABLET | Freq: Two times a day (BID) | ORAL | Status: DC
  Administered 2014-02-18 – 2014-02-19 (×3): 10 mg via ORAL
  Filled 2014-02-18 (×3): qty 1

## 2014-02-18 MED ORDER — SODIUM CHLORIDE 0.9 % IV SOLN
INTRAVENOUS | Status: DC
  Administered 2014-02-18 – 2014-02-19 (×2): via INTRAVENOUS

## 2014-02-18 MED ORDER — PHENYLEPHRINE 40 MCG/ML (10ML) SYRINGE FOR IV PUSH (FOR BLOOD PRESSURE SUPPORT)
PREFILLED_SYRINGE | INTRAVENOUS | Status: AC
  Filled 2014-02-18: qty 10

## 2014-02-18 MED ORDER — METOCLOPRAMIDE HCL 5 MG PO TABS: 5.0000 mg | ORAL_TABLET | Freq: Three times a day (TID) | ORAL | Status: DC | PRN

## 2014-02-18 MED ORDER — POLYETHYLENE GLYCOL 3350 17 G PO PACK: 17.0000 g | PACK | Freq: Every day | ORAL | Status: DC | PRN

## 2014-02-18 MED ORDER — SORBITOL 70 % SOLN
30.0000 mL | Freq: Every day | Status: DC | PRN
  Filled 2014-02-18: qty 30

## 2014-02-18 MED ORDER — FENTANYL CITRATE 0.05 MG/ML IJ SOLN
INTRAMUSCULAR | Status: DC | PRN
  Administered 2014-02-18: 50 ug via INTRAVENOUS
  Administered 2014-02-18: 100 ug via INTRAVENOUS
  Administered 2014-02-18 (×2): 50 ug via INTRAVENOUS

## 2014-02-18 MED ORDER — CELECOXIB 200 MG PO CAPS
200.0000 mg | ORAL_CAPSULE | Freq: Two times a day (BID) | ORAL | Status: DC
  Administered 2014-02-18: 200 mg via ORAL
  Filled 2014-02-18 (×3): qty 1

## 2014-02-18 SURGICAL SUPPLY — 57 items
ADH SKN CLS APL DERMABOND .7 (GAUZE/BANDAGES/DRESSINGS)
BIT DRILL STRL 25MM (BIT) IMPLANT
BLADE SAW SGTL 18X1.27X75 (BLADE) ×2 IMPLANT
BNDG COHESIVE 4X5 TAN STRL (GAUZE/BANDAGES/DRESSINGS) ×1 IMPLANT
CAPT HIP TOTAL 2 ×1 IMPLANT
CELLS DAT CNTRL 66122 CELL SVR (MISCELLANEOUS) ×1 IMPLANT
COVER SURGICAL LIGHT HANDLE (MISCELLANEOUS) ×1 IMPLANT
DERMABOND ADVANCED (GAUZE/BANDAGES/DRESSINGS)
DERMABOND ADVANCED .7 DNX12 (GAUZE/BANDAGES/DRESSINGS) IMPLANT
DRAPE C-ARM 42X72 X-RAY (DRAPES) ×2 IMPLANT
DRAPE IMP U-DRAPE 54X76 (DRAPES) ×2 IMPLANT
DRAPE INCISE IOBAN 66X45 STRL (DRAPES) ×1 IMPLANT
DRAPE STERI IOBAN 125X83 (DRAPES) ×2 IMPLANT
DRAPE U-SHAPE 47X51 STRL (DRAPES) ×6 IMPLANT
DRILL BIT STERILE 25MM (BIT) ×2
DRSG AQUACEL AG ADV 3.5X10 (GAUZE/BANDAGES/DRESSINGS) ×2 IMPLANT
DRSG AQUACEL AG ADV 3.5X14 (GAUZE/BANDAGES/DRESSINGS) ×1 IMPLANT
DURAPREP 26ML APPLICATOR (WOUND CARE) ×2 IMPLANT
ELECT BLADE 4.0 EZ CLEAN MEGAD (MISCELLANEOUS) ×2
ELECT REM PT RETURN 9FT ADLT (ELECTROSURGICAL) ×2
ELECTRODE BLDE 4.0 EZ CLN MEGD (MISCELLANEOUS) ×1 IMPLANT
ELECTRODE REM PT RTRN 9FT ADLT (ELECTROSURGICAL) ×1 IMPLANT
FACESHIELD WRAPAROUND (MASK) ×2 IMPLANT
FACESHIELD WRAPAROUND OR TEAM (MASK) ×1 IMPLANT
GAUZE XEROFORM 5X9 LF (GAUZE/BANDAGES/DRESSINGS) ×2 IMPLANT
GLOVE NEODERM STRL 7.5 LF PF (GLOVE) ×2 IMPLANT
GLOVE SURG NEODERM 7.5  LF PF (GLOVE) ×2
GLOVE SURG SYN 7.5  E (GLOVE) ×1
GLOVE SURG SYN 7.5 E (GLOVE) ×1 IMPLANT
GLOVE SURG SYN 7.5 PF PI (GLOVE) ×1 IMPLANT
GOWN SRG XL XLNG 56XLVL 4 (GOWN DISPOSABLE) ×2 IMPLANT
GOWN STRL NON-REIN XL XLG LVL4 (GOWN DISPOSABLE) ×4
HANDPIECE INTERPULSE COAX TIP (DISPOSABLE) ×2
HOOD PEEL AWAY FACE SHEILD DIS (HOOD) ×2 IMPLANT
HOOD SURGICAL BLUE (PROTECTIVE WEAR) ×2 IMPLANT
KIT BASIN OR (CUSTOM PROCEDURE TRAY) ×2 IMPLANT
MARKER SKIN DUAL TIP RULER LAB (MISCELLANEOUS) ×2 IMPLANT
PACK TOTAL JOINT (CUSTOM PROCEDURE TRAY) ×2 IMPLANT
PACK UNIVERSAL I (CUSTOM PROCEDURE TRAY) ×2 IMPLANT
PADDING CAST COTTON 6X4 STRL (CAST SUPPLIES) ×4 IMPLANT
RETRACTOR WND ALEXIS 18 MED (MISCELLANEOUS) ×1 IMPLANT
RTRCTR WOUND ALEXIS 18CM MED (MISCELLANEOUS) ×2
SET HNDPC FAN SPRY TIP SCT (DISPOSABLE) ×1 IMPLANT
STAPLER ENDO GIA 45 3.5 BLUE (STAPLE) ×2 IMPLANT
STRIP CLOSURE SKIN 1/2X4 (GAUZE/BANDAGES/DRESSINGS) ×2 IMPLANT
SUT ETHIBOND NAB CT1 #1 30IN (SUTURE) ×6 IMPLANT
SUT MNCRL AB 3-0 PS2 18 (SUTURE) ×2 IMPLANT
SUT VIC AB 0 CT1 27 (SUTURE) ×2
SUT VIC AB 0 CT1 27XBRD ANBCTR (SUTURE) ×1 IMPLANT
SUT VIC AB 1 CT1 27 (SUTURE) ×2
SUT VIC AB 1 CT1 27XBRD ANBCTR (SUTURE) ×1 IMPLANT
SUT VIC AB 2-0 CT1 27 (SUTURE) ×4
SUT VIC AB 2-0 CT1 TAPERPNT 27 (SUTURE) ×2 IMPLANT
TOWEL OR 17X26 10 PK STRL BLUE (TOWEL DISPOSABLE) ×2 IMPLANT
TOWEL OR NON WOVEN STRL DISP B (DISPOSABLE) ×2 IMPLANT
TRAY FOLEY CATH 16FR SILVER (SET/KITS/TRAYS/PACK) ×2 IMPLANT
TUBE SUCT ARGYLE STRL (TUBING) ×2 IMPLANT

## 2014-02-18 NOTE — Op Note (Signed)
RIGHT TOTAL HIP ARTHROPLASTY ANTERIOR APPROACH  Procedure Note Reece AgarJoshua J Nghiem Jr.   [id number]  Pre-op Diagnosis: Right hip osteoarthritis     Post-op Diagnosis: same   Operative Procedures  1. Total hip replacement; Right hip; uncemented [id number]   Personnel  Surgeon(s): Takira Sherrin Glee ArvinMichael Shirlena Brinegar, MD   Anesthesia: spinal, epidural   Prosthesis: Katrinka BlazingSmith and Nephew Acetabulum: R3 54 mm Femur: Anthology 5 HO Head: 36 size: +4 Bearing Type: Oxinium on poly  Date of Service: 02/18/2014  Total Hip Arthroplasty (Anterior Approach) Op Note:  After informed consent was obtained and the operative extremity marked in the holding area, the patient was brought back to the operating room and placed supine on the HANA table. Next, the operative extremity was prepped and draped in normal sterile fashion. Surgical timeout occurred verifying patient identification, surgical site, surgical procedure and administration of antibiotics.  A modified anterior Smith-Peterson approach to the hip was performed, using the interval between tensor fascia lata and sartorius.  Dissection was carried bluntly down onto the anterior hip capsule. The lateral femoral circumflex vessels were identified and coagulated. A capsulotomy was performed and the capsular flaps tagged for later repair.  Fluoroscopy was utilized to prepare for the femoral neck cut. The neck osteotomy was performed. The femoral head was removed, the acetabular rim was cleared of soft tissue and attention was turned to reaming the acetabulum.  Sequential reaming was performed under fluoroscopic guidance. We reamed to a size 54 mm, and then impacted the acetabular shell. The liner was then placed after irrigation and attention turned to the femur.  After placing the femoral hook, the leg was taken to externally rotated, extended and adducted position taking care to perform soft tissue releases to allow for adequate mobilization of the femur. Soft tissue was  cleared from the shoulder of the greater trochanter and the hook elevator used to improve exposure of the proximal femur. Sequential broaching performed up to a size 5. Trial neck and head were placed. The leg was brought back up to neutral and the construct reduced. The position and sizing of components, offset and leg lengths were checked using fluoroscopy. Stability of the construct was checked in extension and external rotation without any subluxation or impingement of prosthesis. We dislocated the prosthesis, dropped the leg back into position, removed trial components, and irrigated copiously. The final stem and head was then placed, the leg brought back up, the system reduced and fluoroscopy used to verify positioning.  We irrigated, obtained hemostasis.  Dilute betadyne solution was used. The fascia was closed with #1 vicryl plus, the deep fat layer was closed with 0 vicryl, the subcutaneous layers closed with 2.0 Vicryl Plus and the skin closed with staples. A sterile dressing was applied. The patient was awakened in the operating room and taken to recovery in stable condition. All sponge, needle, and instrument counts were correct at the end of the case.   Position: supine  Complications: none.  Time Out: performed   Drains/Packing: none  Estimated blood loss: 300 cc  Returned to Recovery Room: in good condition.   Antibiotics: yes   Mechanical VTE (DVT) Prophylaxis: sequential compression devices, TED thigh-high  Chemical VTE (DVT) Prophylaxis: aspirin (other pharmacologic prophylaxis not  indicated - bleeding risk)   Fluid Replacement  Crystalloid: see anesthesia record Blood: none  FFP: none   Specimens Removed: 1 to pathology   Sponge and Instrument Count Correct? yes   PACU: portable radiograph - low AP   Admission:  inpatient status, start PT & OT POD#1  Plan/RTC: Return in 2 weeks for staple removal. Return in 6 weeks to see MD.  Weight Bearing/Load Lower Extremity:  full  Hip precautions: none Suture Removal: 10-14 days  Betadine to incision twice daily once dressing is removed on POD#7  N. Glee Arvin, MD [facility] [phone] 4:34 PM

## 2014-02-18 NOTE — Anesthesia Procedure Notes (Signed)
Procedure Name: Intubation Date/Time: 02/18/2014 1:01 PM Performed by: Sharlene DoryWALKER, Ryan Ogborn E Pre-anesthesia Checklist: Patient identified, Emergency Drugs available, Suction available, Patient being monitored and Timeout performed Patient Re-evaluated:Patient Re-evaluated prior to inductionOxygen Delivery Method: Circle system utilized Preoxygenation: Pre-oxygenation with 100% oxygen Intubation Type: IV induction Ventilation: Mask ventilation without difficulty Laryngoscope Size: Mac and 4 Grade View: Grade I Tube type: Oral Tube size: 7.5 mm Number of attempts: 1 Airway Equipment and Method: Stylet Placement Confirmation: ETT inserted through vocal cords under direct vision,  positive ETCO2 and breath sounds checked- equal and bilateral Secured at: 22 cm Tube secured with: Tape Dental Injury: Teeth and Oropharynx as per pre-operative assessment

## 2014-02-18 NOTE — Anesthesia Preprocedure Evaluation (Addendum)
Anesthesia Evaluation  Patient identified by MRN, date of birth, ID band  Reviewed: Allergy & Precautions, NPO status , Patient's Chart, lab work & pertinent test results, reviewed documented beta blocker date and time   Airway Mallampati: II   Neck ROM: Full    Dental  (+) Partial Upper, Partial Lower, Dental Advisory Given   Pulmonary  breath sounds clear to auscultation        Cardiovascular hypertension, Pt. on medications  EKG WNL 02/2013   Neuro/Psych    GI/Hepatic negative GI ROS, Hep C exposure, no meds for this   Endo/Other  diabetes, Type 2  Renal/GU negative Renal ROS     Musculoskeletal   Abdominal (+) + obese,   Peds  Hematology   Anesthesia Other Findings   Reproductive/Obstetrics                        Anesthesia Physical Anesthesia Plan  ASA: II  Anesthesia Plan: General   Post-op Pain Management:    Induction: Intravenous  Airway Management Planned: Oral ETT  Additional Equipment:   Intra-op Plan:   Post-operative Plan: Extubation in OR  Informed Consent: I have reviewed the patients History and Physical, chart, labs and discussed the procedure including the risks, benefits and alternatives for the proposed anesthesia with the patient or authorized representative who has indicated his/her understanding and acceptance.     Plan Discussed with:   Anesthesia Plan Comments:         Anesthesia Quick Evaluation

## 2014-02-18 NOTE — Transfer of Care (Signed)
Immediate Anesthesia Transfer of Care Note  Patient: Kevin AgarJoshua J Pulver Jr.  Procedure(s) Performed: Procedure(s): RIGHT TOTAL HIP ARTHROPLASTY ANTERIOR APPROACH (Right)  Patient Location: PACU  Anesthesia Type:General  Level of Consciousness: awake, alert , oriented and patient cooperative  Airway & Oxygen Therapy: Patient Spontanous Breathing and Patient connected to nasal cannula oxygen  Post-op Assessment: Report given to PACU RN and Post -op Vital signs reviewed and stable  Post vital signs: Reviewed and stable  Complications: No apparent anesthesia complications

## 2014-02-19 ENCOUNTER — Encounter (HOSPITAL_COMMUNITY): Payer: Self-pay | Admitting: Orthopaedic Surgery

## 2014-02-19 LAB — CBC
HEMATOCRIT: 32.1 % — AB (ref 39.0–52.0)
Hemoglobin: 10.7 g/dL — ABNORMAL LOW (ref 13.0–17.0)
MCH: 28.5 pg (ref 26.0–34.0)
MCHC: 33.3 g/dL (ref 30.0–36.0)
MCV: 85.6 fL (ref 78.0–100.0)
PLATELETS: 190 10*3/uL (ref 150–400)
RBC: 3.75 MIL/uL — ABNORMAL LOW (ref 4.22–5.81)
RDW: 16.7 % — AB (ref 11.5–15.5)
WBC: 8.5 10*3/uL (ref 4.0–10.5)

## 2014-02-19 LAB — BASIC METABOLIC PANEL
ANION GAP: 9 (ref 5–15)
BUN: 17 mg/dL (ref 6–23)
CALCIUM: 8.7 mg/dL (ref 8.4–10.5)
CHLORIDE: 105 mmol/L (ref 96–112)
CO2: 22 mmol/L (ref 19–32)
Creatinine, Ser: 1.58 mg/dL — ABNORMAL HIGH (ref 0.50–1.35)
GFR calc non Af Amer: 50 mL/min — ABNORMAL LOW (ref >90)
GFR, EST AFRICAN AMERICAN: 58 mL/min — AB (ref >90)
Glucose, Bld: 124 mg/dL — ABNORMAL HIGH (ref 70–99)
Potassium: 3.7 mmol/L (ref 3.5–5.1)
Sodium: 136 mmol/L (ref 135–145)

## 2014-02-19 LAB — GLUCOSE, CAPILLARY
Glucose-Capillary: 110 mg/dL — ABNORMAL HIGH (ref 70–99)
Glucose-Capillary: 119 mg/dL — ABNORMAL HIGH (ref 70–99)
Glucose-Capillary: 131 mg/dL — ABNORMAL HIGH (ref 70–99)
Glucose-Capillary: 125 mg/dL — ABNORMAL HIGH (ref 70–99)

## 2014-02-19 MED ORDER — ASPIRIN EC 325 MG PO TBEC
325.0000 mg | DELAYED_RELEASE_TABLET | Freq: Two times a day (BID) | ORAL | Status: DC
Start: 2014-02-19 — End: 2017-06-15

## 2014-02-19 MED ORDER — OXYCODONE HCL ER 10 MG PO T12A: 10.0000 mg | EXTENDED_RELEASE_TABLET | Freq: Two times a day (BID) | ORAL | Status: DC

## 2014-02-19 MED ORDER — OXYCODONE HCL 5 MG PO TABS: 5.0000 mg | ORAL_TABLET | ORAL | Status: DC | PRN

## 2014-02-19 NOTE — Progress Notes (Signed)
Utilization review completed.  

## 2014-02-19 NOTE — Discharge Instructions (Signed)
1. Remove dressing on 02/26/14.  2. Begin betadine paints to incision twice a day with dry dressing. 3. May get incision wet while showering 10 days after surgery.  Do not submerge incision until notified. 4. WBAT 5. Ice the operative 20 minutes on and 20 minutes off around the clock.  6. Avoid any dental visits including dental cleanings for 6 months after surgery.

## 2014-02-19 NOTE — Clinical Social Work Note (Signed)
CSW received referral for SNF PT and OT recommend home health PT, plan is to discharge home.  CSW to sign off please re-consult if social work needs arise.  Ervin KnackEric R. Allizon Woznick, MSW, [employer]CSWA [phone]

## 2014-02-19 NOTE — Anesthesia Postprocedure Evaluation (Signed)
Anesthesia Post Note  Patient: Kevin AgarJoshua J Cleverly Jr.  Procedure(s) Performed: Procedure(s) (LRB): RIGHT TOTAL HIP ARTHROPLASTY ANTERIOR APPROACH (Right)  Anesthesia type: General  Patient location: PACU  Post pain: Pain level controlled  Post assessment: Post-op Vital signs reviewed  Last Vitals: BP 135/77 mmHg  Pulse 99  Temp(Src) 37.2 C (Oral)  Resp 20  Ht 5\' 10"  (1.778 m)  Wt 290 lb (131.543 kg)  BMI 41.61 kg/m2  SpO2 95%  Post vital signs: Reviewed  Level of consciousness: sedated  Complications: No apparent anesthesia complications

## 2014-02-19 NOTE — Evaluation (Signed)
Physical Therapy Evaluation Patient Details Name: Kevin AgarJoshua J Revuelta Jr. MRN: [id number] DOB: 1965/05/03 Today's Date: 02/19/2014   History of Present Illness  pt is a 49 yo male s/p Rt THA.  Clinical Impression  Pt is s/p anterior THA resulting in the deficits listed below (see PT Problem List).  Pt will benefit from skilled PT to increase their independence and safety with mobility to allow discharge to the venue listed below. Pt hopes to D/C home Saturday.      Follow Up Recommendations Home health PT;Supervision/Assistance - 24 hour    Equipment Recommendations  None recommended by PT    Recommendations for Other Services OT consult     Precautions / Restrictions Precautions Precautions: Anterior Hip Precaution Comments: direct anterior  Restrictions Weight Bearing Restrictions: Yes RLE Weight Bearing: Weight bearing as tolerated      Mobility  Bed Mobility Overal bed mobility: Needs Assistance Bed Mobility: Supine to Sit     Supine to sit: Min guard;HOB elevated     General bed mobility comments: min guard to control Rt Le off EOB and to floor; cues for hand placement and sequencing   Transfers Overall transfer level: Needs assistance Equipment used: Rolling walker (2 wheeled) Transfers: Sit to/from Stand Sit to Stand: Min guard;From elevated surface         General transfer comment: bed elevated; min guard to steady; cues for sequencing   Ambulation/Gait Ambulation/Gait assistance: Min guard Ambulation Distance (Feet): 60 Feet Assistive device: Rolling walker (2 wheeled) Gait Pattern/deviations: Step-to pattern;Decreased stance time - right;Decreased step length - left;Antalgic;Trunk flexed;Wide base of support Gait velocity: decr due to pain Gait velocity interpretation: Below normal speed for age/gender General Gait Details: cues for upright posture and step through gt sequencing; pt fatigued quickly   Stairs            Wheelchair Mobility     Modified Rankin (Stroke Patients Only)       Balance Overall balance assessment: Needs assistance Sitting-balance support: Feet supported;No upper extremity supported Sitting balance-Leahy Scale: Fair Sitting balance - Comments: denies dizziness   Standing balance support: During functional activity;Bilateral upper extremity supported Standing balance-Leahy Scale: Poor Standing balance comment: RW to balance                              Pertinent Vitals/Pain Pain Assessment: 0-10 Pain Score: 9  Pain Location: Rt hip after activity  Pain Descriptors / Indicators: Aching;Sore Pain Intervention(s): Monitored during session;Premedicated before session;Repositioned;Ice applied    Home Living Family/patient expects to be discharged to:: Private residence Living Arrangements: Non-relatives/Friends Available Help at Discharge: Personal care attendant;Family;Available PRN/intermittently Type of Home: Apartment Home Access: Level entry     Home Layout: One level Home Equipment: Bedside commode;Cane - single point;Wheelchair - power;Walker - 2 wheels Additional Comments: aide comes in 7 days a week for ~3 hours     Prior Function Level of Independence: Needs assistance   Gait / Transfers Assistance Needed: Pt uses cane for ambulation or power wheelchair    ADL's / Homemaking Assistance Needed: Pt has HH aide to assist with ADLs and homemaking.        Hand Dominance   Dominant Hand: Right    Extremity/Trunk Assessment   Upper Extremity Assessment: Defer to OT evaluation           Lower Extremity Assessment: Generalized weakness;RLE deficits/detail RLE Deficits / Details: quad 3+/5; hip 2+/5 - limited by  pain     Cervical / Trunk Assessment: Normal  Communication   Communication: No difficulties  Cognition Arousal/Alertness: Awake/alert Behavior During Therapy: WFL for tasks assessed/performed Overall Cognitive Status: Within Functional Limits for  tasks assessed                      General Comments General comments (skin integrity, edema, etc.): encouraged OOB for all meals with nursing    Exercises Total Joint Exercises Ankle Circles/Pumps: AROM;Both;10 reps Quad Sets: AROM;Strengthening;Right;10 reps;Seated Long Arc Quad: AROM;Strengthening;Right;10 reps;Seated      Assessment/Plan    PT Assessment Patient needs continued PT services  PT Diagnosis Difficulty walking;Generalized weakness;Acute pain   PT Problem List Decreased strength;Decreased activity tolerance;Decreased balance;Decreased mobility;Decreased knowledge of use of DME;Decreased safety awareness;Pain  PT Treatment Interventions DME instruction;Gait training;Functional mobility training;Therapeutic activities;Therapeutic exercise;Balance training;Neuromuscular re-education;Patient/family education   PT Goals (Current goals can be found in the Care Plan section) Acute Rehab PT Goals Patient Stated Goal: to go home saturday PT Goal Formulation: With patient Time For Goal Achievement: 02/26/14 Potential to Achieve Goals: Good    Frequency 7X/week   Barriers to discharge        Co-evaluation               End of Session Equipment Utilized During Treatment: Gait belt Activity Tolerance: Patient tolerated treatment well Patient left: in chair;with call bell/phone within reach Nurse Communication: Mobility status;Weight bearing status;Precautions         Time: 1610-9604 PT Time Calculation (min) (ACUTE ONLY): 18 min   Charges:   PT Evaluation $Initial PT Evaluation Tier I: 1 Procedure     PT G CodesDonell Sievert, Rabun  [phone] 02/19/2014, 11:54 AM

## 2014-02-19 NOTE — Progress Notes (Signed)
   Subjective:  Patient reports pain as mild.    Objective:   VITALS:   Filed Vitals:   02/19/14 0000 02/19/14 0117 02/19/14 0400 02/19/14 0600  BP:  135/77  101/60  Pulse:  99  106  Temp:  99 F (37.2 C)  99.4 F (37.4 C)  TempSrc:  Oral  Oral  Resp: 18 18 20 16   Height:      Weight:      SpO2: 94% 94% 95% 95%    Neurologically intact Neurovascular intact Sensation intact distally Intact pulses distally Dorsiflexion/Plantar flexion intact Incision: dressing C/D/I and no drainage No cellulitis present Compartment soft   Lab Results  Component Value Date   WBC 8.5 02/19/2014   HGB 10.7* 02/19/2014   HCT 32.1* 02/19/2014   MCV 85.6 02/19/2014   PLT 190 02/19/2014     Assessment/Plan:  1 Day Post-Op   - Expected postop acute blood loss anemia - will monitor for symptoms - Up with PT/OT - DVT ppx - SCDs, ambulation, asa - WBAT right lower extremity - Cr elevated, stopped acei, HCTZ, celebrex, toradol and increased IVFs - BMP ordered for am - Pain control - Discharge planning  Kevin Jennings, Kevin Jennings 02/19/2014, 7:41 AM [phone]

## 2014-02-19 NOTE — Evaluation (Signed)
Occupational Therapy Evaluation Patient Details Name: Kevin AgarJoshua J Daughety Jr. MRN: [id number] DOB: [birth year] Today's Date: 02/19/2014    History of Present Illness pt is a 49 yo male s/p Rt THA.   Clinical Impression   Patient evaluated by Occupational Therapy with no further acute OT needs identified. All education has been completed and the patient has no further questions. All education completed.  He will have necessary assistance at home upon discharge.   See below for any follow-up Occupational Therapy or equipment needs. OT is signing off. Thank you for this referral.      Follow Up Recommendations  No OT follow up;Supervision/Assistance - 24 hour    Equipment Recommendations  None recommended by OT    Recommendations for Other Services       Precautions / Restrictions Precautions Precautions: None Precaution Comments: Direct anterior approach Restrictions Weight Bearing Restrictions: Yes RLE Weight Bearing: Weight bearing as tolerated      Mobility Bed Mobility Overal bed mobility: Needs Assistance Bed Mobility: Supine to Sit     Supine to sit: Min guard;HOB elevated     General bed mobility comments: min guard to control Rt Le off EOB and to floor; cues for hand placement and sequencing   Transfers Overall transfer level: Needs assistance Equipment used: Rolling walker (2 wheeled) Transfers: Sit to/from UGI CorporationStand;Stand Pivot Transfers Sit to Stand: Min guard;From elevated surface Stand pivot transfers: Min guard       General transfer comment: verbal cues for hand placement     Balance Overall balance assessment: Needs assistance Sitting-balance support: Feet supported Sitting balance-Leahy Scale: Good Sitting balance - Comments: denies dizziness   Standing balance support: During functional activity;Single extremity supported Standing balance-Leahy Scale: Poor Standing balance comment: RW to balance                             ADL Overall  ADL's : Needs assistance/impaired Eating/Feeding: Independent   Grooming: Wash/dry hands;Wash/dry face;Oral care;Brushing hair;Min guard;Standing   Upper Body Bathing: Set up;Sitting   Lower Body Bathing: Minimal assistance;Sit to/from stand   Upper Body Dressing : Set up;Sitting   Lower Body Dressing: Moderate assistance;Sit to/from stand   Toilet Transfer: Min guard;Ambulation;Comfort height toilet;BSC;RW   Toileting- ArchitectClothing Manipulation and Hygiene: Min guard;Sit to/from Nurse, children'sstand     Tub/Shower Transfer Details (indicate cue type and reason): Pt will sponge bathe for first two weeks  Functional mobility during ADLs: Min guard;Rolling walker General ADL Comments: Pt not yet able to access Rt foot for LB ADLs, but he is able to bend forward to touch Rt. ankle.  Pt reports he will have adequate assist at home, and feels confident with his ability to manage his ADLs.      Vision                     Perception     Praxis      Pertinent Vitals/Pain Pain Assessment: 0-10 Pain Score: 5  Pain Location: Rt hip Pain Descriptors / Indicators: Aching Pain Intervention(s): Repositioned;Ice applied;Monitored during session     Hand Dominance Right   Extremity/Trunk Assessment Upper Extremity Assessment Upper Extremity Assessment: Overall WFL for tasks assessed   Lower Extremity Assessment Lower Extremity Assessment: Defer to PT evaluation RLE Deficits / Details: quad 3+/5; hip 2+/5 - limited by pain  RLE: Unable to fully assess due to pain RLE Coordination: decreased gross motor   Cervical / Trunk Assessment  Cervical / Trunk Assessment: Normal   Communication Communication Communication: No difficulties   Cognition Arousal/Alertness: Awake/alert Behavior During Therapy: WFL for tasks assessed/performed Overall Cognitive Status: Within Functional Limits for tasks assessed                     General Comments       Exercises Exercises: Total Joint      Shoulder Instructions      Home Living Family/patient expects to be discharged to:: Private residence Living Arrangements: Non-relatives/Friends Available Help at Discharge: Personal care attendant;Family;Available PRN/intermittently Type of Home: Apartment Home Access: Level entry     Home Layout: One level     Bathroom Shower/Tub: Tub/shower unit Shower/tub characteristics: Engineer, building services: Standard     Home Equipment: Bedside commode;Cane - single point;Wheelchair - power;Walker - 2 wheels;Shower seat   Additional Comments: aide comes in 7 days a week for ~3 hours       Prior Functioning/Environment Level of Independence: Needs assistance  Gait / Transfers Assistance Needed: Pt uses cane for ambulation or power wheelchair   ADL's / Homemaking Assistance Needed: Pt reports he has been performing BADLs mod I.  His aid assists with IADLs         OT Diagnosis: Generalized weakness;Acute pain   OT Problem List:     OT Treatment/Interventions:      OT Goals(Current goals can be found in the care plan section) Acute Rehab OT Goals Patient Stated Goal: to go home saturday OT Goal Formulation: All assessment and education complete, DC therapy  OT Frequency:     Barriers to D/C:            Co-evaluation              End of Session Equipment Utilized During Treatment: Rolling walker Nurse Communication: Mobility status  Activity Tolerance: Patient tolerated treatment well Patient left: in chair;with call bell/phone within reach;with family/visitor present   Time: 1610-9604 OT Time Calculation (min): 16 min Charges:  OT General Charges $OT Visit: 1 Procedure OT Evaluation $Initial OT Evaluation Tier I: 1 Procedure G-Codes:    Kevin Jennings M February 23, 2014, 1:07 PM

## 2014-02-20 LAB — CBC
HEMATOCRIT: 30.5 % — AB (ref 39.0–52.0)
Hemoglobin: 10.3 g/dL — ABNORMAL LOW (ref 13.0–17.0)
MCH: 28.8 pg (ref 26.0–34.0)
MCHC: 33.8 g/dL (ref 30.0–36.0)
MCV: 85.2 fL (ref 78.0–100.0)
PLATELETS: 180 10*3/uL (ref 150–400)
RBC: 3.58 MIL/uL — ABNORMAL LOW (ref 4.22–5.81)
RDW: 16.7 % — ABNORMAL HIGH (ref 11.5–15.5)
WBC: 7.9 10*3/uL (ref 4.0–10.5)

## 2014-02-20 LAB — BASIC METABOLIC PANEL
Anion gap: 4 — ABNORMAL LOW (ref 5–15)
BUN: 15 mg/dL (ref 6–23)
CO2: 28 mmol/L (ref 19–32)
Calcium: 8.7 mg/dL (ref 8.4–10.5)
Chloride: 105 mmol/L (ref 96–112)
Creatinine, Ser: 1.37 mg/dL — ABNORMAL HIGH (ref 0.50–1.35)
GFR calc non Af Amer: 60 mL/min — ABNORMAL LOW (ref >90)
GFR, EST AFRICAN AMERICAN: 69 mL/min — AB (ref >90)
GLUCOSE: 105 mg/dL — AB (ref 70–99)
Potassium: 3.7 mmol/L (ref 3.5–5.1)
Sodium: 137 mmol/L (ref 135–145)

## 2014-02-20 LAB — GLUCOSE, CAPILLARY: GLUCOSE-CAPILLARY: 100 mg/dL — AB (ref 70–99)

## 2014-02-20 NOTE — Progress Notes (Signed)
Physical Therapy Treatment Patient Details Name: Kevin AgarJoshua J Truex Jr. MRN: [id number] DOB: 06/16/1965 Today's Date: 02/20/2014    History of Present Illness pt is a 49 yo male s/p Rt THA.    PT Comments    Pt making steady progress and being dc'd home.  Follow Up Recommendations  Home health PT;Supervision/Assistance - 24 hour     Equipment Recommendations  None recommended by PT    Recommendations for Other Services       Precautions / Restrictions Precautions Precautions: None Precaution Comments: Direct anterior approach Restrictions RLE Weight Bearing: Weight bearing as tolerated    Mobility  Bed Mobility                  Transfers Overall transfer level: Needs assistance Equipment used: Rolling walker (2 wheeled) Transfers: Sit to/from Stand Sit to Stand: Supervision         General transfer comment: Pt with incr time and effort to rise from low recliner but no assist given.  Ambulation/Gait Ambulation/Gait assistance: Supervision Ambulation Distance (Feet): 8 Feet Assistive device: Rolling walker (2 wheeled) Gait Pattern/deviations: Step-to pattern;Decreased stance time - right;Decreased step length - right;Decreased step length - left;Antalgic;Trunk flexed   Gait velocity interpretation: Below normal speed for age/gender General Gait Details: Verbal cues to stand more erect. Distance limited by pt being dc'd home and his ride was waiting downstairs.   Stairs            Wheelchair Mobility    Modified Rankin (Stroke Patients Only)       Balance     Sitting balance-Leahy Scale: Normal       Standing balance-Leahy Scale: Poor Standing balance comment: rolling walker for support.                    Cognition Arousal/Alertness: Awake/alert Behavior During Therapy: WFL for tasks assessed/performed Overall Cognitive Status: Within Functional Limits for tasks assessed                      Exercises       General Comments        Pertinent Vitals/Pain Pain Score: 5  Pain Location: rt hip Pain Descriptors / Indicators: Sore Pain Intervention(s): Repositioned;Limited activity within patient's tolerance    Home Living                      Prior Function            PT Goals (current goals can now be found in the care plan section) Progress towards PT goals: Progressing toward goals    Frequency  7X/week    PT Plan Current plan remains appropriate    Co-evaluation             End of Session   Activity Tolerance: Patient tolerated treatment well Patient left: in chair (wheelchair for dc)     Time: 4098-11910909-0919 PT Time Calculation (min) (ACUTE ONLY): 10 min  Charges:  $Gait Training: 8-22 mins                    G Codes:      Kevin Jennings 02/20/2014, 9:24 AM  Skip Mayerary [facility] PT [phone]
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
## 2014-02-20 NOTE — Progress Notes (Signed)
Pt telling RN at time of discharge that his HHPT was arranged pre[facility].  [facility] to make sure that they are aware of the pt's need and that he has discharged home.   Carlyle LipaMichelle Kahil Agner, RN BSN MHA CCM  Case Manager, Trauma Service/Unit [facility] [phone]

## 2014-02-20 NOTE — Progress Notes (Signed)
Patient discharged to home with intructions. 

## 2014-02-20 NOTE — Progress Notes (Signed)
   Subjective:  Patient reports pain as mild.    Objective:   VITALS:   Filed Vitals:   02/19/14 0954 02/19/14 2107 02/19/14 2115 02/20/14 0500  BP: 117/76 131/73  127/72  Pulse: 85 104  107  Temp: 98.2 F (36.8 C) 100 F (37.8 C)  100.3 F (37.9 C)  TempSrc: Oral Oral  Oral  Resp: 16 15  16   Height:      Weight:      SpO2: 95% 89% 93% 92%    Neurologically intact Neurovascular intact Sensation intact distally Intact pulses distally Dorsiflexion/Plantar flexion intact Incision: dressing C/D/I and no drainage No cellulitis present Compartment soft   Lab Results  Component Value Date   WBC 7.9 02/20/2014   HGB 10.3* 02/20/2014   HCT 30.5* 02/20/2014   MCV 85.2 02/20/2014   PLT 180 02/20/2014     Assessment/Plan:  2 Days Post-Op   - Cr improved - Hgb stable - dressing c/d/i - home today after HHPT is set up, patient wants [facility] - Rx in chart  Cheral AlmasXu, Dalisha Shively Michael 02/20/2014, 7:36 AM [phone]

## 2014-08-30 ENCOUNTER — Emergency Department (HOSPITAL_COMMUNITY): Payer: Medicare Other

## 2014-08-30 ENCOUNTER — Emergency Department (HOSPITAL_COMMUNITY)
Admission: EM | Admit: 2014-08-30 | Discharge: 2014-08-30 | Disposition: A | Discharge status: Home / Self Care | Payer: Medicare Other | Attending: Emergency Medicine | Admitting: Emergency Medicine

## 2014-08-30 ENCOUNTER — Encounter (HOSPITAL_COMMUNITY): Payer: Self-pay | Admitting: *Deleted

## 2014-08-30 DIAGNOSIS — I10 Essential (primary) hypertension: Secondary | ICD-10-CM | POA: Insufficient documentation

## 2014-08-30 DIAGNOSIS — Y998 Other external cause status: Secondary | ICD-10-CM | POA: Insufficient documentation

## 2014-08-30 DIAGNOSIS — Y929 Unspecified place or not applicable: Secondary | ICD-10-CM | POA: Diagnosis not present

## 2014-08-30 DIAGNOSIS — Z7982 Long term (current) use of aspirin: Secondary | ICD-10-CM | POA: Diagnosis not present

## 2014-08-30 DIAGNOSIS — Z79899 Other long term (current) drug therapy: Secondary | ICD-10-CM | POA: Insufficient documentation

## 2014-08-30 DIAGNOSIS — E785 Hyperlipidemia, unspecified: Secondary | ICD-10-CM | POA: Insufficient documentation

## 2014-08-30 DIAGNOSIS — M25552 Pain in left hip: Secondary | ICD-10-CM

## 2014-08-30 DIAGNOSIS — Y9389 Activity, other specified: Secondary | ICD-10-CM | POA: Diagnosis not present

## 2014-08-30 DIAGNOSIS — Z96641 Presence of right artificial hip joint: Secondary | ICD-10-CM | POA: Diagnosis not present

## 2014-08-30 DIAGNOSIS — Z7951 Long term (current) use of inhaled steroids: Secondary | ICD-10-CM | POA: Insufficient documentation

## 2014-08-30 DIAGNOSIS — E119 Type 2 diabetes mellitus without complications: Secondary | ICD-10-CM | POA: Insufficient documentation

## 2014-08-30 DIAGNOSIS — Z8719 Personal history of other diseases of the digestive system: Secondary | ICD-10-CM | POA: Insufficient documentation

## 2014-08-30 DIAGNOSIS — S79912A Unspecified injury of left hip, initial encounter: Secondary | ICD-10-CM | POA: Diagnosis present

## 2014-08-30 DIAGNOSIS — Z8669 Personal history of other diseases of the nervous system and sense organs: Secondary | ICD-10-CM | POA: Diagnosis not present

## 2014-08-30 DIAGNOSIS — Z96642 Presence of left artificial hip joint: Secondary | ICD-10-CM | POA: Insufficient documentation

## 2014-08-30 DIAGNOSIS — W1839XA Other fall on same level, initial encounter: Secondary | ICD-10-CM | POA: Diagnosis not present

## 2014-08-30 DIAGNOSIS — M1990 Unspecified osteoarthritis, unspecified site: Secondary | ICD-10-CM | POA: Insufficient documentation

## 2014-08-30 MED ORDER — FENTANYL CITRATE (PF) 100 MCG/2ML IJ SOLN
100.0000 ug | Freq: Once | INTRAMUSCULAR | Status: AC
  Administered 2014-08-30: 100 ug via INTRAVENOUS
  Filled 2014-08-30: qty 2

## 2014-08-30 MED ORDER — OXYCODONE-ACETAMINOPHEN 5-325 MG PO TABS: 1.0000 | ORAL_TABLET | Freq: Four times a day (QID) | ORAL | Status: DC | PRN

## 2014-08-30 NOTE — ED Notes (Signed)
Pt ambulated well in room  ?

## 2014-08-30 NOTE — ED Notes (Signed)
Pt arrives from home via [facility]. Pt states his left hip has "given out" twice and has had 2 falls in the past 2 days. Pt states his left hip was replaced in 10/2013 and his right hip was replaced on 01/2014. Pt denies any injuries from falls.
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
## 2014-08-30 NOTE — Discharge Instructions (Signed)
Take pain medication as needed for pain.  Do not drive or operate heavy machinery for 4-6 hours after pain medication.

## 2014-08-30 NOTE — ED Notes (Signed)
Pt is in stable condition upon d/c and is escorted home via [facility]. 

## 2014-08-30 NOTE — ED Notes (Signed)
[facility] CALLED TO P/U PT

## 2014-08-30 NOTE — ED Notes (Signed)
Patient transported to X-ray 

## 2014-11-30 ENCOUNTER — Other Ambulatory Visit (HOSPITAL_COMMUNITY): Payer: Self-pay | Admitting: Orthopaedic Surgery

## 2014-12-01 ENCOUNTER — Other Ambulatory Visit (HOSPITAL_COMMUNITY): Payer: Self-pay | Admitting: Orthopaedic Surgery

## 2014-12-01 DIAGNOSIS — M25551 Pain in right hip: Secondary | ICD-10-CM

## 2014-12-01 DIAGNOSIS — M25552 Pain in left hip: Principal | ICD-10-CM

## 2014-12-08 ENCOUNTER — Encounter (HOSPITAL_COMMUNITY)
Admission: RE | Admit: 2014-12-08 | Discharge: 2014-12-08 | Disposition: A | Discharge status: Home / Self Care | Payer: Medicare Other | Source: Ambulatory Visit | Attending: Orthopaedic Surgery | Admitting: Orthopaedic Surgery

## 2014-12-08 DIAGNOSIS — M25552 Pain in left hip: Secondary | ICD-10-CM | POA: Diagnosis present

## 2014-12-08 DIAGNOSIS — M25551 Pain in right hip: Secondary | ICD-10-CM | POA: Insufficient documentation

## 2014-12-08 MED ORDER — TECHNETIUM TC 99M MEDRONATE IV KIT
26.1000 | PACK | Freq: Once | INTRAVENOUS | Status: AC | PRN
  Administered 2014-12-08: 26.1 via INTRAVENOUS

## 2015-09-11 ENCOUNTER — Encounter (INDEPENDENT_AMBULATORY_CARE_PROVIDER_SITE_OTHER): Payer: Self-pay

## 2015-10-01 IMAGING — CR DG PORTABLE PELVIS
2 series · 2 of 2 positions shown · non-contrast
Comparison: Intraoperative imaging performed earlier today.

CLINICAL DATA: Status post left hip arthroplasty.

EXAM:
PORTABLE PELVIS 1-2 VIEWS

[AP (1 of 2)]
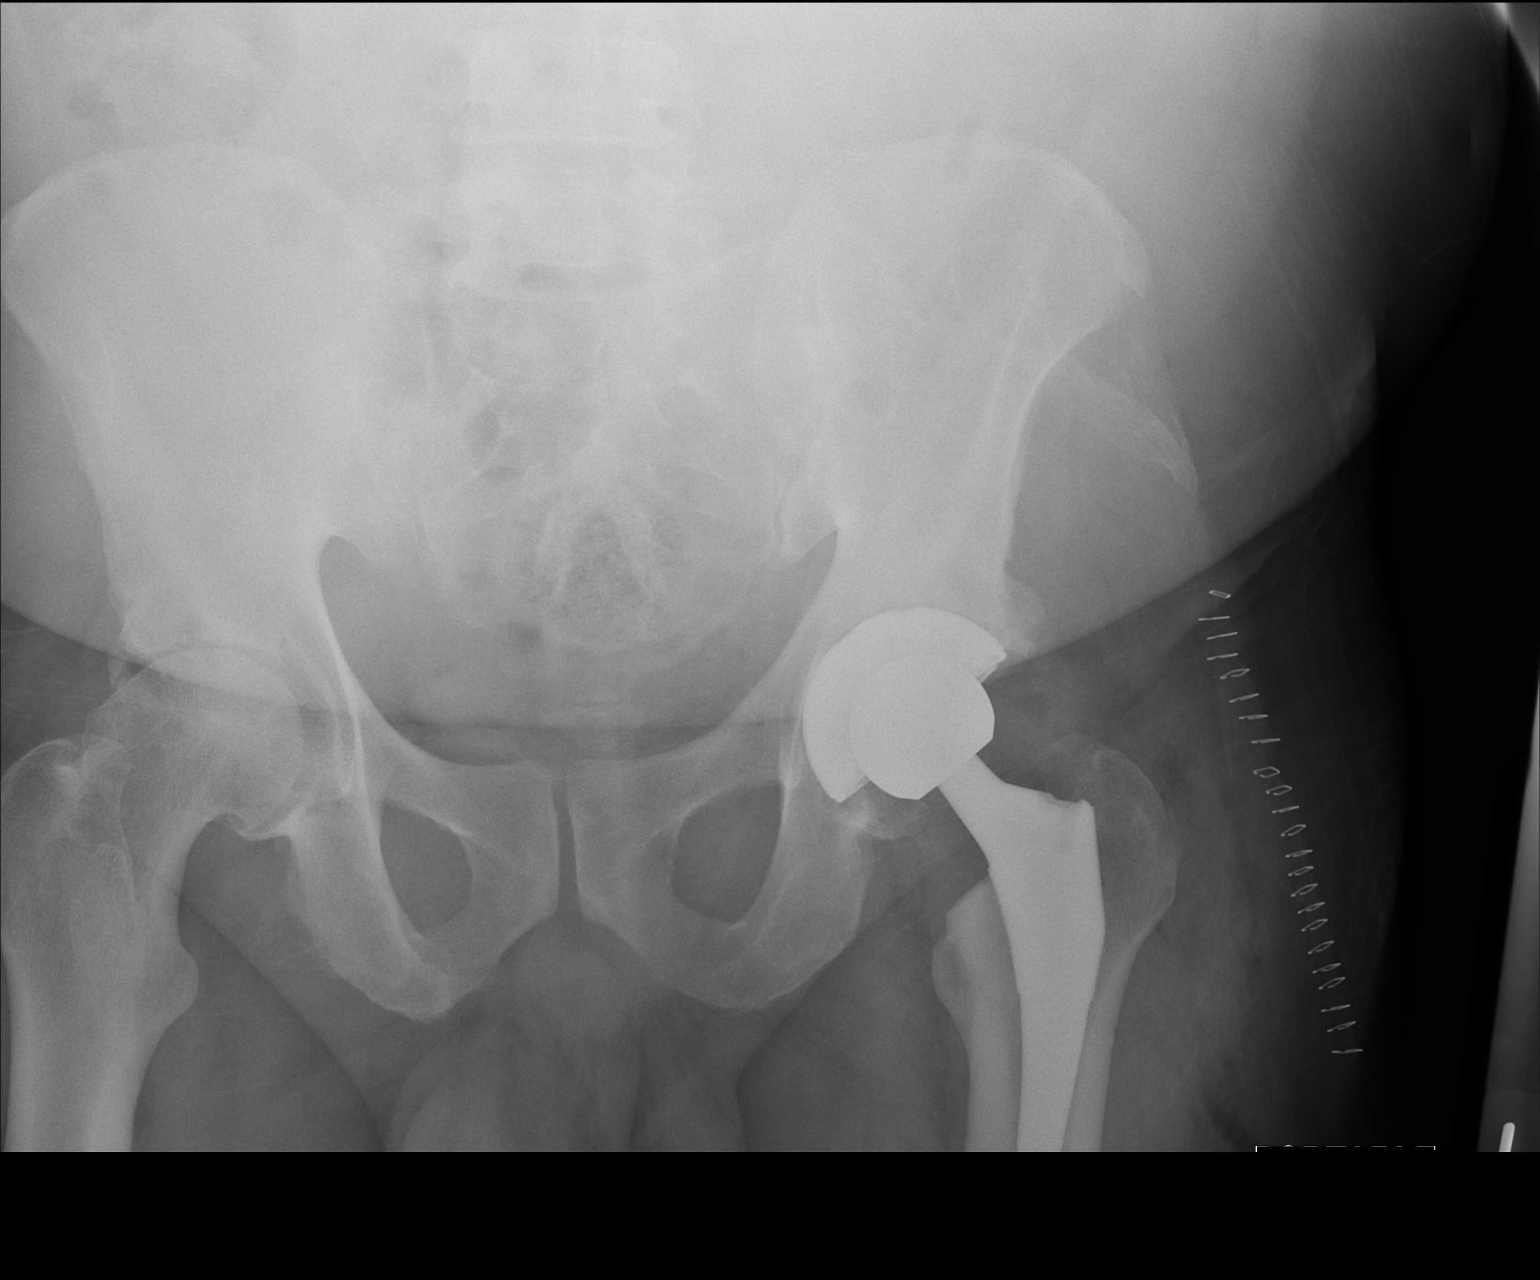

[AP (2 of 2)]
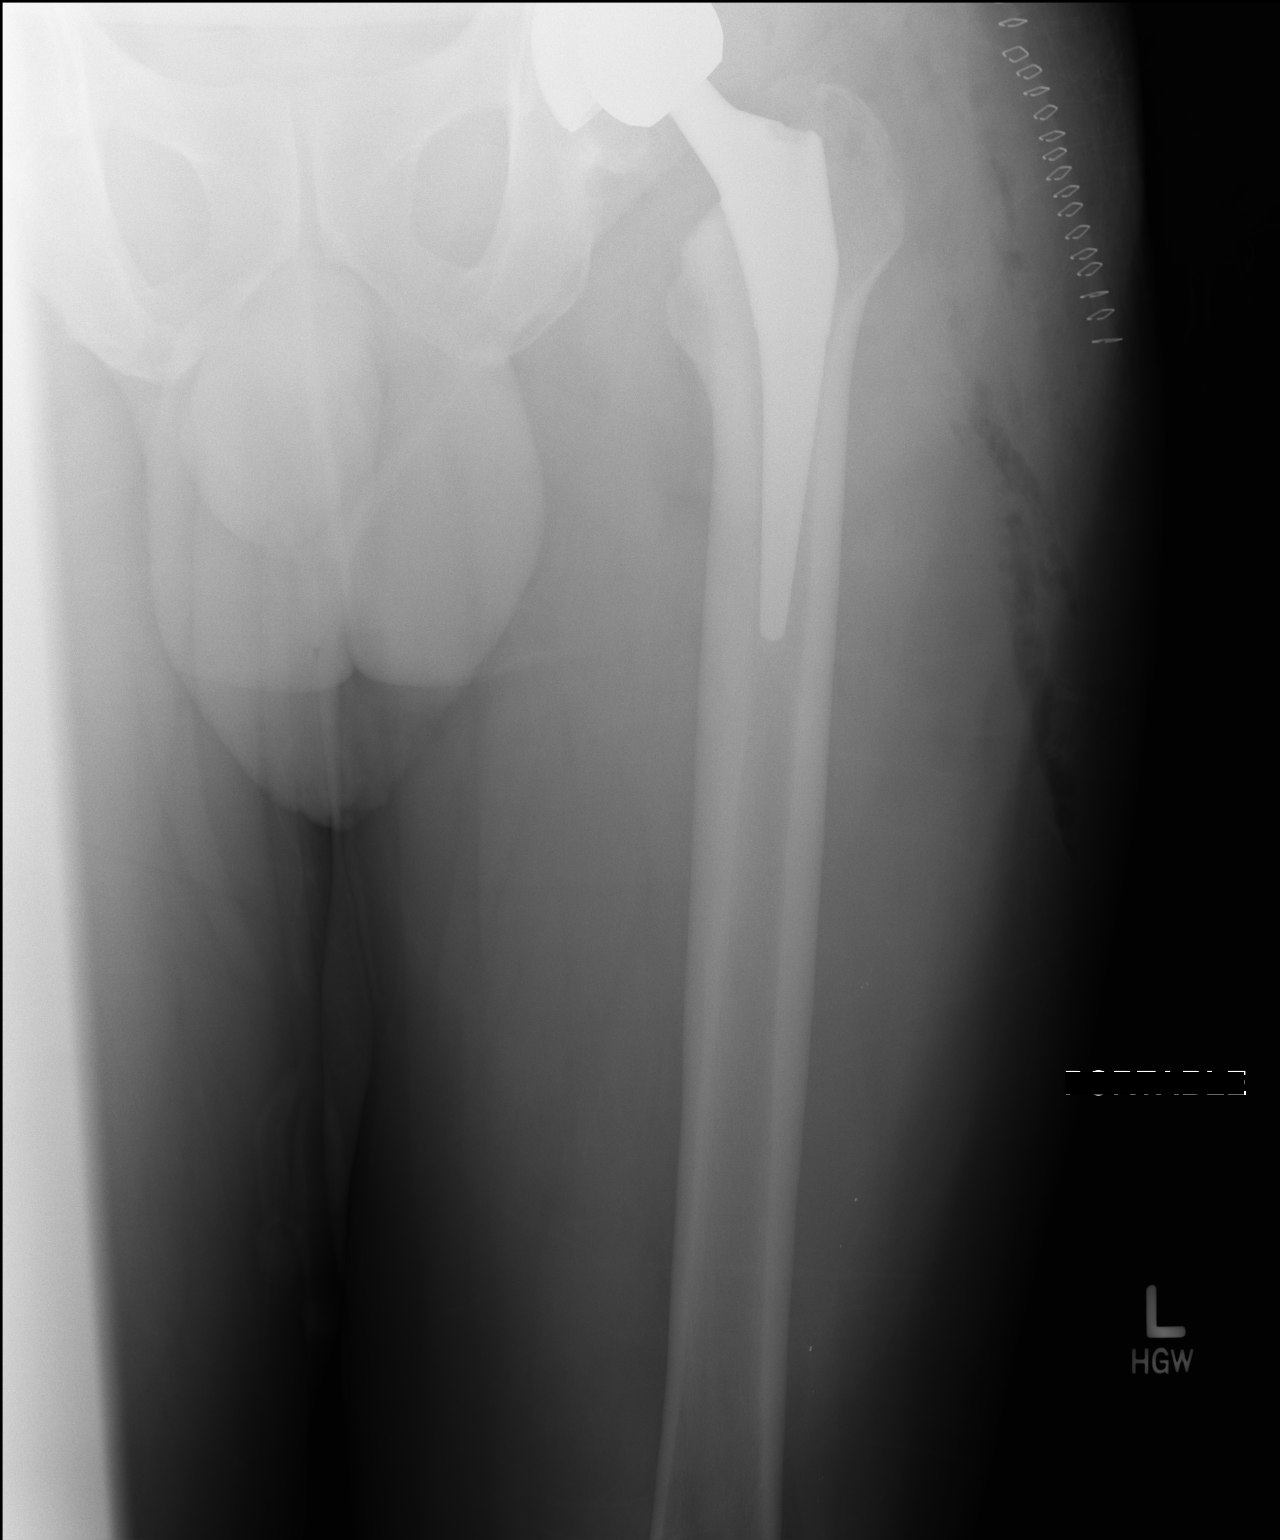

[2 of 2 positions shown; findings below may reference images not displayed]

FINDINGS: Frontal projection films including the entire arthroplasty
demonstrates normal alignment. No abnormal lucency or fracture is
seen around arthroplasty components. The bony pelvis appears normal.
IMPRESSION: Normal alignment of left hip arthroplasty.

## 2015-10-01 IMAGING — RF DG HIP OPERATIVE*L*
1 series · 2 of 2 positions shown · non-contrast
Comparison: None.

CLINICAL DATA: Left hip arthroplasty for osteoarthritis.

EXAM:
OPERATIVE LEFT HIP

[Series 1: run · 2 of 2 slices shown]
[im 1/2]
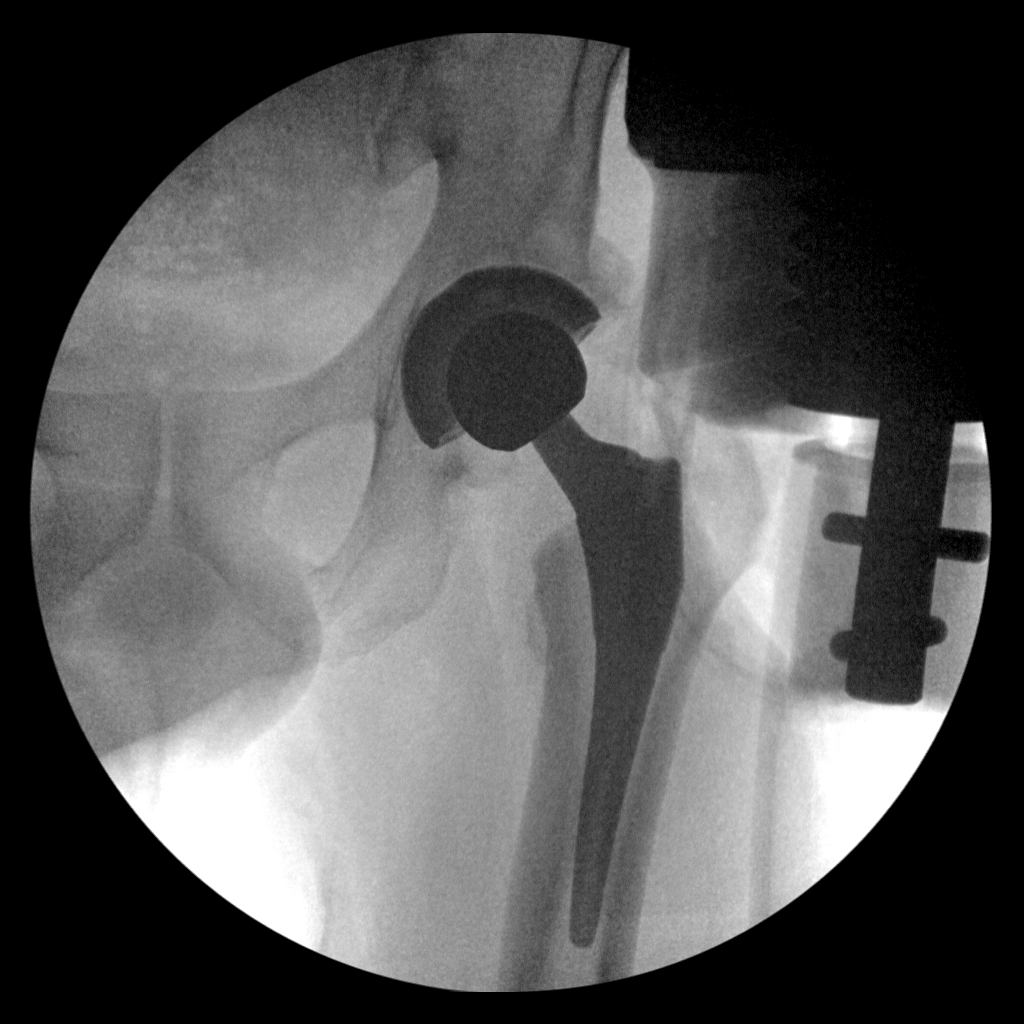
[im 2/2]
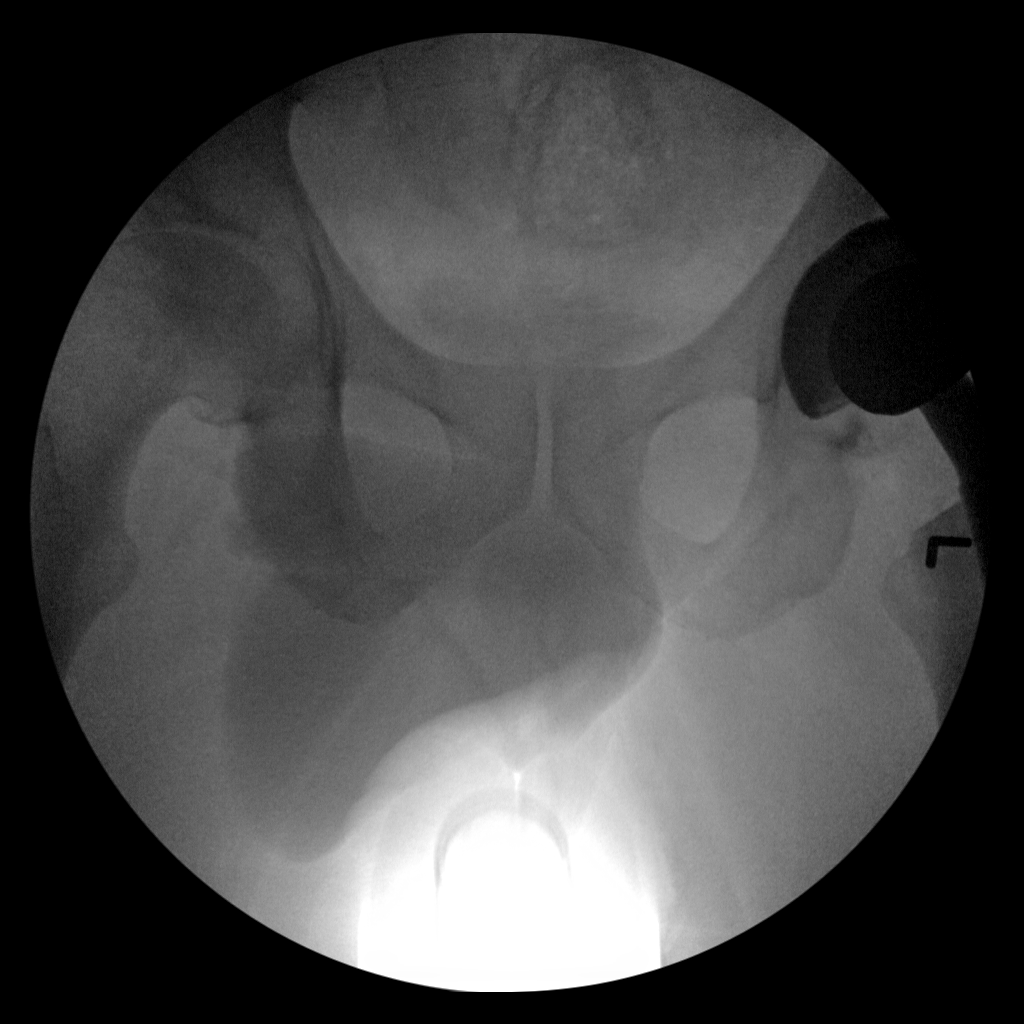

[2 of 2 positions shown; findings below may reference images not displayed]

FINDINGS: Intraoperative imaging shows normal alignment of a left hip total
arthroplasty. No fracture is identified.
IMPRESSION: Normal alignment status post left hip arthroplasty.

## 2015-12-02 ENCOUNTER — Encounter (HOSPITAL_COMMUNITY): Payer: Self-pay | Admitting: Emergency Medicine

## 2015-12-02 ENCOUNTER — Emergency Department (HOSPITAL_COMMUNITY): Payer: Medicare Other

## 2015-12-02 ENCOUNTER — Emergency Department (HOSPITAL_COMMUNITY)
Admission: EM | Admit: 2015-12-02 | Discharge: 2015-12-02 | Disposition: A | Discharge status: Home / Self Care | Payer: Medicare Other | Attending: Emergency Medicine | Admitting: Emergency Medicine

## 2015-12-02 DIAGNOSIS — Y939 Activity, unspecified: Secondary | ICD-10-CM | POA: Insufficient documentation

## 2015-12-02 DIAGNOSIS — X501XXA Overexertion from prolonged static or awkward postures, initial encounter: Secondary | ICD-10-CM | POA: Diagnosis not present

## 2015-12-02 DIAGNOSIS — S8992XA Unspecified injury of left lower leg, initial encounter: Secondary | ICD-10-CM | POA: Diagnosis present

## 2015-12-02 DIAGNOSIS — E119 Type 2 diabetes mellitus without complications: Secondary | ICD-10-CM | POA: Diagnosis not present

## 2015-12-02 DIAGNOSIS — Z7984 Long term (current) use of oral hypoglycemic drugs: Secondary | ICD-10-CM | POA: Insufficient documentation

## 2015-12-02 DIAGNOSIS — I10 Essential (primary) hypertension: Secondary | ICD-10-CM | POA: Diagnosis not present

## 2015-12-02 DIAGNOSIS — Z7982 Long term (current) use of aspirin: Secondary | ICD-10-CM | POA: Insufficient documentation

## 2015-12-02 DIAGNOSIS — Z96642 Presence of left artificial hip joint: Secondary | ICD-10-CM | POA: Diagnosis not present

## 2015-12-02 DIAGNOSIS — M25562 Pain in left knee: Secondary | ICD-10-CM

## 2015-12-02 DIAGNOSIS — S8392XA Sprain of unspecified site of left knee, initial encounter: Secondary | ICD-10-CM

## 2015-12-02 DIAGNOSIS — Y929 Unspecified place or not applicable: Secondary | ICD-10-CM | POA: Diagnosis not present

## 2015-12-02 DIAGNOSIS — Y999 Unspecified external cause status: Secondary | ICD-10-CM | POA: Insufficient documentation

## 2015-12-02 MED ORDER — HYDROCODONE-ACETAMINOPHEN 5-325 MG PO TABS: 1.0000 | ORAL_TABLET | Freq: Four times a day (QID) | ORAL | 0 refills | Status: DC | PRN

## 2015-12-02 MED ORDER — HYDROCODONE-ACETAMINOPHEN 5-325 MG PO TABS
1.0000 | ORAL_TABLET | Freq: Once | ORAL | Status: AC
  Administered 2015-12-02: 1 via ORAL
  Filled 2015-12-02: qty 1

## 2015-12-02 MED ORDER — NAPROXEN 500 MG PO TABS: 500.0000 mg | ORAL_TABLET | Freq: Two times a day (BID) | ORAL | 0 refills | Status: DC | PRN

## 2015-12-02 NOTE — ED Notes (Signed)
Returned from xray

## 2015-12-02 NOTE — ED Notes (Signed)
ED Provider at bedside. 

## 2015-12-02 NOTE — Discharge Instructions (Signed)
Wear knee immobilizer for at least 2 weeks for stabilization of knee. Use crutches or your cane as needed for comfort. Ice and elevate knee throughout the day, using ice pack for no more than 20 minutes every hour. Alternate between naprosyn and norco for pain relief. Do not drive or operate machinery with pain medication use. Call your orthopedic surgeon today or tomorrow to schedule a follow up appointment in 5-7 days for recheck and ongoing evaluation of your knee injury. Return to the ER for changes or worsening symptoms.
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
## 2015-12-02 NOTE — ED Notes (Signed)
Left knee swollen.  Unable to fully extend or flex.  Able to move toes, distal CMS in tact.

## 2015-12-02 NOTE — ED Triage Notes (Signed)
Pt twisted left knee last week while moving; knee swollen.  Iced it yesterday with mild relief.  Pain increases with ambulation.  All else in tact.

## 2015-12-02 NOTE — ED Triage Notes (Signed)
Patient complains of left medial knee pain x1 week after he states he twisted his knee and fell on a wet floor.  The knee is swollen and warm to the touch.  Patient states he heard a pop in his knee when he slipped and has been icing the injury.  He states the injury hasn't improved and wants to have it examined.  Patient has been walking on the extremity with difficulty, states he can walk on flat surfaces but inclines and stairs are too painful.

## 2015-12-02 NOTE — Progress Notes (Signed)
Orthopedic Tech Progress Note Patient Details:  Kevin AgarJoshua J Toops Jr. [birth year] [id number]  Ortho Devices Type of Ortho Device: Knee Immobilizer Ortho Device/Splint Interventions: Application   [name] 12/02/2015, 11:21 AM

## 2015-12-13 ENCOUNTER — Encounter (INDEPENDENT_AMBULATORY_CARE_PROVIDER_SITE_OTHER): Payer: Self-pay | Admitting: Orthopaedic Surgery

## 2015-12-13 ENCOUNTER — Ambulatory Visit (INDEPENDENT_AMBULATORY_CARE_PROVIDER_SITE_OTHER): Payer: Medicare Other | Admitting: Orthopaedic Surgery

## 2015-12-13 DIAGNOSIS — M1712 Unilateral primary osteoarthritis, left knee: Secondary | ICD-10-CM | POA: Insufficient documentation

## 2015-12-13 MED ORDER — LIDOCAINE HCL 1 % IJ SOLN
2.0000 mL | INTRAMUSCULAR | Status: AC | PRN
  Administered 2015-12-13: 2 mL

## 2015-12-13 MED ORDER — BUPIVACAINE HCL 0.5 % IJ SOLN
2.0000 mL | INTRAMUSCULAR | Status: AC | PRN
  Administered 2015-12-13: 2 mL via INTRA_ARTICULAR

## 2015-12-13 MED ORDER — METHYLPREDNISOLONE ACETATE 40 MG/ML IJ SUSP
40.0000 mg | INTRAMUSCULAR | Status: AC | PRN
  Administered 2015-12-13: 40 mg via INTRA_ARTICULAR
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
## 2015-12-13 NOTE — Patient Instructions (Signed)

## 2016-01-03 ENCOUNTER — Ambulatory Visit (INDEPENDENT_AMBULATORY_CARE_PROVIDER_SITE_OTHER): Payer: Medicare Other | Admitting: Orthopaedic Surgery

## 2016-01-03 ENCOUNTER — Encounter (INDEPENDENT_AMBULATORY_CARE_PROVIDER_SITE_OTHER): Payer: Self-pay | Admitting: Orthopaedic Surgery

## 2016-01-03 DIAGNOSIS — M1712 Unilateral primary osteoarthritis, left knee: Secondary | ICD-10-CM
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
## 2016-01-03 NOTE — Addendum Note (Signed)
Addended by: Albertina ParrGARCIA, Johari [place] on: 01/03/2016 10:02 AM   Modules accepted: Orders

## 2016-01-14 ENCOUNTER — Ambulatory Visit
Admission: RE | Admit: 2016-01-14 | Discharge: 2016-01-14 | Disposition: A | Discharge status: Home / Self Care | Payer: Medicare Other | Source: Ambulatory Visit | Attending: Orthopaedic Surgery | Admitting: Orthopaedic Surgery

## 2016-01-14 DIAGNOSIS — M1712 Unilateral primary osteoarthritis, left knee: Secondary | ICD-10-CM

## 2016-01-20 ENCOUNTER — Encounter (INDEPENDENT_AMBULATORY_CARE_PROVIDER_SITE_OTHER): Payer: Self-pay | Admitting: Orthopaedic Surgery

## 2016-01-20 ENCOUNTER — Ambulatory Visit (INDEPENDENT_AMBULATORY_CARE_PROVIDER_SITE_OTHER): Payer: Medicare Other | Admitting: Orthopaedic Surgery

## 2016-01-20 DIAGNOSIS — M1712 Unilateral primary osteoarthritis, left knee: Secondary | ICD-10-CM | POA: Diagnosis not present

## 2016-01-20 MED ORDER — MELOXICAM 7.5 MG PO TABS: 7.5000 mg | ORAL_TABLET | Freq: Two times a day (BID) | ORAL | 2 refills | Status: DC | PRN
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
## 2016-02-17 ENCOUNTER — Telehealth (INDEPENDENT_AMBULATORY_CARE_PROVIDER_SITE_OTHER): Payer: Self-pay

## 2016-02-17 NOTE — Telephone Encounter (Signed)
Called pt to let him know he was approved by Monovisc. We can buy and bill. He just needs an appointment to get the monovisc inj for left knee. LMOM with details.

## 2016-02-21 ENCOUNTER — Ambulatory Visit (INDEPENDENT_AMBULATORY_CARE_PROVIDER_SITE_OTHER): Payer: Medicare Other | Admitting: Orthopaedic Surgery

## 2016-02-21 ENCOUNTER — Encounter (INDEPENDENT_AMBULATORY_CARE_PROVIDER_SITE_OTHER): Payer: Self-pay | Admitting: Orthopaedic Surgery

## 2016-02-21 DIAGNOSIS — M1712 Unilateral primary osteoarthritis, left knee: Secondary | ICD-10-CM

## 2016-02-21 MED ORDER — HYALURONAN 88 MG/4ML IX SOSY
88.0000 mg | PREFILLED_SYRINGE | INTRA_ARTICULAR | Status: AC | PRN
  Administered 2016-02-21: 88 mg via INTRA_ARTICULAR

## 2016-02-21 NOTE — Progress Notes (Signed)
   Procedure Note  Patient: Kevin AgarJoshua J Nader Jr.             Date of Birth: May 28, 1965           MRN: [id number]             Visit Date: 02/21/2016  Procedures: Visit Diagnoses: Unilateral primary osteoarthritis, left knee  Large Joint Inj Date/Time: 02/21/2016 7:01 PM Performed by: Tarry KosXU, NAIPING M Authorized by: Tarry KosXU, NAIPING M   Consent Given by:  Patient Timeout: prior to procedure the correct patient, procedure, and site was verified   Indications:  Pain Location:  Knee Site:  L knee Prep: patient was prepped and draped in usual sterile fashion   Needle Size:  22 G Approach:  Anterolateral Ultrasound Guidance: No   Fluoroscopic Guidance: No   Arthrogram: No   Medications:  88 mg Hyaluronan 88 MG/4ML

## 2016-06-14 LAB — GLUCOSE, POCT (MANUAL RESULT ENTRY): POC GLUCOSE: 88 mg/dL (ref 70–99)

## 2016-11-09 ENCOUNTER — Emergency Department (HOSPITAL_COMMUNITY)
Admission: EM | Admit: 2016-11-09 | Discharge: 2016-11-09 | Disposition: A | Discharge status: Home / Self Care | Payer: Medicare Other | Attending: Emergency Medicine | Admitting: Emergency Medicine

## 2016-11-09 ENCOUNTER — Encounter (HOSPITAL_COMMUNITY): Payer: Self-pay | Admitting: Emergency Medicine

## 2016-11-09 DIAGNOSIS — E119 Type 2 diabetes mellitus without complications: Secondary | ICD-10-CM | POA: Diagnosis not present

## 2016-11-09 DIAGNOSIS — Z7984 Long term (current) use of oral hypoglycemic drugs: Secondary | ICD-10-CM | POA: Diagnosis not present

## 2016-11-09 DIAGNOSIS — Z96643 Presence of artificial hip joint, bilateral: Secondary | ICD-10-CM | POA: Insufficient documentation

## 2016-11-09 DIAGNOSIS — Z7982 Long term (current) use of aspirin: Secondary | ICD-10-CM | POA: Diagnosis not present

## 2016-11-09 DIAGNOSIS — Z79899 Other long term (current) drug therapy: Secondary | ICD-10-CM | POA: Diagnosis not present

## 2016-11-09 DIAGNOSIS — I10 Essential (primary) hypertension: Secondary | ICD-10-CM | POA: Diagnosis not present

## 2016-11-09 DIAGNOSIS — R51 Headache: Secondary | ICD-10-CM | POA: Diagnosis present

## 2016-11-09 DIAGNOSIS — J0110 Acute frontal sinusitis, unspecified: Secondary | ICD-10-CM | POA: Insufficient documentation

## 2016-11-09 MED ORDER — OXYMETAZOLINE HCL 0.05 % NA SOLN: 1.0000 | Freq: Two times a day (BID) | NASAL | 0 refills | Status: DC

## 2016-11-09 MED ORDER — ACETAMINOPHEN 500 MG PO TABS: 500.0000 mg | ORAL_TABLET | Freq: Four times a day (QID) | ORAL | 0 refills | Status: DC | PRN

## 2016-11-09 MED ORDER — ACETAMINOPHEN 325 MG PO TABS
650.0000 mg | ORAL_TABLET | Freq: Once | ORAL | Status: AC
  Administered 2016-11-09: 650 mg via ORAL
  Filled 2016-11-09: qty 2

## 2016-11-09 MED ORDER — ACETAMINOPHEN 500 MG PO TABS
500.0000 mg | ORAL_TABLET | Freq: Four times a day (QID) | ORAL | 0 refills | Status: DC | PRN
Start: 2016-11-09 — End: 2017-06-15

## 2016-11-09 NOTE — ED Triage Notes (Signed)
Pt states, I've had this sinnus headache for over a week," reports cough, denies fevers, chills.
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
## 2017-03-03 ENCOUNTER — Other Ambulatory Visit (INDEPENDENT_AMBULATORY_CARE_PROVIDER_SITE_OTHER): Payer: Self-pay | Admitting: Orthopaedic Surgery

## 2017-03-05 NOTE — Telephone Encounter (Signed)
yes

## 2017-03-08 ENCOUNTER — Encounter (INDEPENDENT_AMBULATORY_CARE_PROVIDER_SITE_OTHER): Payer: Self-pay | Admitting: Orthopaedic Surgery

## 2017-03-08 ENCOUNTER — Ambulatory Visit (INDEPENDENT_AMBULATORY_CARE_PROVIDER_SITE_OTHER): Payer: Medicare Other

## 2017-03-08 ENCOUNTER — Ambulatory Visit (INDEPENDENT_AMBULATORY_CARE_PROVIDER_SITE_OTHER): Payer: Medicare Other | Admitting: Orthopaedic Surgery

## 2017-03-08 DIAGNOSIS — M79642 Pain in left hand: Secondary | ICD-10-CM

## 2017-03-08 DIAGNOSIS — M79641 Pain in right hand: Secondary | ICD-10-CM | POA: Diagnosis not present

## 2017-03-08 DIAGNOSIS — M1712 Unilateral primary osteoarthritis, left knee: Secondary | ICD-10-CM | POA: Diagnosis not present

## 2017-03-08 MED ORDER — DICLOFENAC SODIUM 1 % TD GEL: 2.0000 g | Freq: Four times a day (QID) | TRANSDERMAL | 5 refills | Status: DC
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
## 2017-03-08 NOTE — Addendum Note (Signed)
Addended by: Cherre HugerMAY, Mayvis Agudelo E on: 03/08/2017 04:50 PM   Modules accepted: Orders

## 2017-03-29 ENCOUNTER — Other Ambulatory Visit (INDEPENDENT_AMBULATORY_CARE_PROVIDER_SITE_OTHER): Payer: Self-pay | Admitting: Orthopaedic Surgery

## 2017-04-03 ENCOUNTER — Ambulatory Visit (INDEPENDENT_AMBULATORY_CARE_PROVIDER_SITE_OTHER): Payer: Medicare Other | Admitting: Physical Medicine and Rehabilitation

## 2017-04-03 ENCOUNTER — Encounter (INDEPENDENT_AMBULATORY_CARE_PROVIDER_SITE_OTHER): Payer: Self-pay | Admitting: Physical Medicine and Rehabilitation

## 2017-04-03 DIAGNOSIS — R202 Paresthesia of skin: Secondary | ICD-10-CM

## 2017-04-03 NOTE — Progress Notes (Signed)
.  Numeric Pain Rating Scale and Functional Assessment Average Pain 10   In the last MONTH (on 0-10 scale) has pain interfered with the following?  1. General activity like being  able to carry out your everyday physical activities such as walking, climbing stairs, carrying groceries, or moving a chair?  Rating(10)     

## 2017-04-05 NOTE — Procedures (Signed)
EMG & NCV Findings: Evaluation of the left median motor and the right median motor nerves showed prolonged distal onset latency (L5.8, R7.0 ms) and decreased conduction velocity (Elbow-Wrist, L45, R42 m/s).  The left median (across palm) sensory and the right median (across palm) sensory nerves showed no response (Palm), prolonged distal peak latency (L6.9, R9.3 ms), and reduced amplitude (L2.2, R5.2 V).  The left ulnar sensory nerve showed prolonged distal peak latency (4.1 ms) and decreased conduction velocity (Wrist-5th Digit, 34 m/s).  The right ulnar sensory nerve showed reduced amplitude (5.3 V).  All remaining nerves (as indicated in the following tables) were within normal limits.  Left vs. Right side comparison data for the median motor nerve indicates abnormal L-R latency difference (1.2 ms).  The ulnar sensory nerve indicates abnormal L-R latency difference (0.6 ms) and abnormal L-R amplitude difference (66.9 %).  All remaining left vs. right side differences were within normal limits.    All examined muscles (as indicated in the following table) showed no evidence of electrical instability.    Impression: The above electrodiagnostic study is ABNORMAL and reveals evidence of:  1. 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   2. A moderate left median nerve entrapment at the wrist (carpal tunnel syndrome) affecting sensory and motor components.   There is no significant electrodiagnostic evidence of any other focal nerve entrapment, brachial plexopathy or generalized peripheral neuropathy.   Recommendations: 1.  Follow-up with referring physician. 2.  Continue current management of symptoms. 3.  Continue use of resting splint at night-time and as needed during the day. 4.  Suggest surgical evaluation.  Nerve Conduction Studies Anti Sensory Summary Table   Stim Site NR Peak (ms) Norm Peak (ms) P-T  Amp (V) Norm P-T Amp Site1 Site2 Delta-P (ms) Dist (cm) Vel (m/s) Norm Vel (m/s)  Left Median Acr Palm Anti Sensory (2nd Digit)  31.5C  Wrist    *6.9 <3.6 *2.2 >10 Wrist Palm  0.0    Palm *NR  <2.0          Right Median Acr Palm Anti Sensory (2nd Digit)  31.4C  Wrist    *9.3 <3.6 *5.2 >10 Wrist Palm  0.0    Palm *NR  <2.0          Left Radial Anti Sensory (Base 1st Digit)  32.4C  Wrist    2.3 <3.1 16.9  Wrist Base 1st Digit 2.3 0.0    Right Radial Anti Sensory (Base 1st Digit)  31.8C  Wrist    2.3 <3.1 26.9  Wrist Base 1st Digit 2.3 0.0    Left Ulnar Anti Sensory (5th Digit)  32.5C  Wrist    *4.1 <3.7 16.0 >15.0 Wrist 5th Digit 4.1 14.0 *34 >38  Right Ulnar Anti Sensory (5th Digit)  32C  Wrist    3.5 <3.7 *5.3 >15.0 Wrist 5th Digit 3.5 14.0 40 >38   Motor Summary Table   Stim Site NR Onset (ms) Norm Onset (ms) O-P Amp (mV) Norm O-P Amp Site1 Site2 Delta-0 (ms) Dist (cm) Vel (m/s) Norm Vel (m/s)  Left Median Motor (Abd Poll Brev)  32.4C  Wrist    *5.8 <4.2 9.2 >5 Elbow Wrist 5.4 24.3 *45 >50  Elbow    11.2  8.8         Right Median Motor (Abd Poll Brev)  31.6C  Wrist    *7.0 <4.2 5.2 >5 Elbow Wrist 6.4 27.0 *  42 >50  Elbow    13.4  4.8         Left Ulnar Motor (Abd Dig Min)  32.8C  Wrist    3.6 <4.2 8.0 >3 B Elbow Wrist 4.6 24.5 53 >53  B Elbow    8.2  9.1  A Elbow B Elbow 1.5 10.0 67 >53  A Elbow    9.7  8.7         Right Ulnar Motor (Abd Dig Min)  31.3C  Wrist    3.6 <4.2 8.5 >3 B Elbow Wrist 4.6 25.5 55 >53  B Elbow    8.2  8.3  A Elbow B Elbow 1.3 10.0 77 >53  A Elbow    9.5  11.2          EMG   Side Muscle Nerve Root Ins Act Fibs Psw Amp Dur Poly Recrt Int Dennie BiblePat Comment  Right Abd Poll Brev Median C8-T1 Nml Nml Nml Nml Nml 0 Nml Nml   Right 1stDorInt Ulnar C8-T1 Nml Nml Nml Nml Nml 0 Nml Nml   Right PronatorTeres Median C6-7 Nml Nml Nml Nml Nml 0 Nml Nml   Right Biceps Musculocut C5-6 Nml Nml Nml Nml Nml 0 Nml Nml   Right Deltoid Axillary C5-6 Nml Nml Nml Nml Nml  0 Nml Nml     Nerve Conduction Studies Anti Sensory Left/Right Comparison   Stim Site L Lat (ms) R Lat (ms) L-R Lat (ms) L Amp (V) R Amp (V) L-R Amp (%) Site1 Site2 L Vel (m/s) R Vel (m/s) L-R Vel (m/s)  Median Acr Palm Anti Sensory (2nd Digit)  31.5C  Wrist *6.9 *9.3 2.4 *2.2 *5.2 57.7 Wrist Palm     Palm             Radial Anti Sensory (Base 1st Digit)  32.4C  Wrist 2.3 2.3 0.0 16.9 26.9 37.2 Wrist Base 1st Digit     Ulnar Anti Sensory (5th Digit)  32.5C  Wrist *4.1 3.5 *0.6 16.0 *5.3 *66.9 Wrist 5th Digit *34 40 6   Motor Left/Right Comparison   Stim Site L Lat (ms) R Lat (ms) L-R Lat (ms) L Amp (mV) R Amp (mV) L-R Amp (%) Site1 Site2 L Vel (m/s) R Vel (m/s) L-R Vel (m/s)  Median Motor (Abd Poll Brev)  32.4C  Wrist *5.8 *7.0 *1.2 9.2 5.2 43.5 Elbow Wrist *45 *42 3  Elbow 11.2 13.4 2.2 8.8 4.8 45.5       Ulnar Motor (Abd Dig Min)  32.8C  Wrist 3.6 3.6 0.0 8.0 8.5 5.9 B Elbow Wrist 53 55 2  B Elbow 8.2 8.2 0.0 9.1 8.3 8.8 A Elbow B Elbow 67 77 10  A Elbow 9.7 9.5 0.2 8.7 11.2 22.3          Waveforms:
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
## 2017-04-19 ENCOUNTER — Other Ambulatory Visit (INDEPENDENT_AMBULATORY_CARE_PROVIDER_SITE_OTHER): Payer: Self-pay

## 2017-04-19 ENCOUNTER — Encounter (INDEPENDENT_AMBULATORY_CARE_PROVIDER_SITE_OTHER): Payer: Self-pay | Admitting: Orthopaedic Surgery

## 2017-04-19 ENCOUNTER — Ambulatory Visit (INDEPENDENT_AMBULATORY_CARE_PROVIDER_SITE_OTHER): Payer: Medicare Other | Admitting: Orthopaedic Surgery

## 2017-04-19 DIAGNOSIS — M1712 Unilateral primary osteoarthritis, left knee: Secondary | ICD-10-CM | POA: Diagnosis not present

## 2017-04-19 DIAGNOSIS — G5602 Carpal tunnel syndrome, left upper limb: Secondary | ICD-10-CM | POA: Diagnosis not present

## 2017-04-19 DIAGNOSIS — G5601 Carpal tunnel syndrome, right upper limb: Secondary | ICD-10-CM | POA: Diagnosis not present
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
## 2017-05-03 ENCOUNTER — Other Ambulatory Visit (INDEPENDENT_AMBULATORY_CARE_PROVIDER_SITE_OTHER): Payer: Self-pay | Admitting: Orthopaedic Surgery

## 2017-05-03 NOTE — Telephone Encounter (Signed)
Can you change to 7.5mg  qd instead of bid and then it can be sent in.  Thank you

## 2017-05-03 NOTE — Telephone Encounter (Signed)
pls advise

## 2017-05-23 ENCOUNTER — Telehealth (INDEPENDENT_AMBULATORY_CARE_PROVIDER_SITE_OTHER): Payer: Self-pay

## 2017-05-23 NOTE — Telephone Encounter (Signed)
So what are our options?

## 2017-05-23 NOTE — Telephone Encounter (Signed)
Hi [name],  I just spoke with pt, Kevin Jennings for 5/3-Dr. Roda Shutters. Pt informed his responsible adult party cannot be present until 2 pm. I explained our facility policy to him that he must have a responsible adult present with him upon arrival, and during the entirety of his stay here, at [facility]. I also explained to him that he must have responsible adult to stay with him 24 hrs following his sx per [facility] facility policy. Pt kept speaking over me and stating he did not have anyone available. I again explained the policy, and informed him he will certainly be cancelled if he cannot follow facility policy/rules. Please be advised, pt may be cancelled DOS. Thank you.  Thank you,  Carolanne Grumbling RN BSN (Pre-op Assessment) [phone] ext [phone] Fax [phone] [email or web address] [facility]

## 2017-05-25 ENCOUNTER — Telehealth (INDEPENDENT_AMBULATORY_CARE_PROVIDER_SITE_OTHER): Payer: Self-pay | Admitting: Orthopedic Surgery

## 2017-05-25 NOTE — Telephone Encounter (Signed)
Kevin Jennings called this morning and advised me to cancel his carpal tunnel release that is scheduled for today.  He states that he does not have transportation and has no one that can wait in the lobby at the Surgical Center during his procedure or watch him for 24hrs after surgery (as suggested by [facility]).  He would like to know if he can have his surgery rescheduled for [facility] OR some time in the near future.

## 2017-06-01 ENCOUNTER — Ambulatory Visit (INDEPENDENT_AMBULATORY_CARE_PROVIDER_SITE_OTHER): Payer: Medicare Other | Admitting: Orthopaedic Surgery

## 2017-06-16 ENCOUNTER — Other Ambulatory Visit (INDEPENDENT_AMBULATORY_CARE_PROVIDER_SITE_OTHER): Payer: Self-pay | Admitting: Orthopaedic Surgery

## 2017-06-20 ENCOUNTER — Encounter (HOSPITAL_COMMUNITY): Payer: Self-pay

## 2017-06-20 ENCOUNTER — Other Ambulatory Visit: Payer: Self-pay

## 2017-06-20 ENCOUNTER — Encounter (HOSPITAL_COMMUNITY)
Admission: RE | Admit: 2017-06-20 | Discharge: 2017-06-20 | Disposition: A | Discharge status: Home / Self Care | Payer: Medicare Other | Source: Ambulatory Visit | Attending: Orthopaedic Surgery | Admitting: Orthopaedic Surgery

## 2017-06-20 DIAGNOSIS — Z01812 Encounter for preprocedural laboratory examination: Secondary | ICD-10-CM | POA: Insufficient documentation

## 2017-06-20 DIAGNOSIS — Z01818 Encounter for other preprocedural examination: Secondary | ICD-10-CM | POA: Insufficient documentation

## 2017-06-20 HISTORY — DX: Depression, unspecified: F32.A

## 2017-06-20 HISTORY — DX: Major depressive disorder, single episode, unspecified: F32.9

## 2017-06-20 HISTORY — DX: Anxiety disorder, unspecified: F41.9

## 2017-06-20 LAB — COMPREHENSIVE METABOLIC PANEL
ALK PHOS: 51 U/L (ref 38–126)
ALT: 32 U/L (ref 17–63)
ANION GAP: 9 (ref 5–15)
AST: 30 U/L (ref 15–41)
Albumin: 4.4 g/dL (ref 3.5–5.0)
BILIRUBIN TOTAL: 1.3 mg/dL — AB (ref 0.3–1.2)
BUN: 21 mg/dL — AB (ref 6–20)
CALCIUM: 9.5 mg/dL (ref 8.9–10.3)
CO2: 23 mmol/L (ref 22–32)
Chloride: 108 mmol/L (ref 101–111)
Creatinine, Ser: 1.3 mg/dL — ABNORMAL HIGH (ref 0.61–1.24)
GFR calc Af Amer: >60 mL/min (ref >60)
GFR calc non Af Amer: >60 mL/min (ref >60)
Glucose, Bld: 107 mg/dL — ABNORMAL HIGH (ref 65–99)
POTASSIUM: 3.5 mmol/L (ref 3.5–5.1)
SODIUM: 140 mmol/L (ref 135–145)
TOTAL PROTEIN: 7.6 g/dL (ref 6.5–8.1)

## 2017-06-20 LAB — CBC
HCT: 39.5 % (ref 39.0–52.0)
Hemoglobin: 13.3 g/dL (ref 13.0–17.0)
MCH: 29.4 pg (ref 26.0–34.0)
MCHC: 33.7 g/dL (ref 30.0–36.0)
MCV: 87.2 fL (ref 78.0–100.0)
Platelets: 242 10*3/uL (ref 150–400)
RBC: 4.53 MIL/uL (ref 4.22–5.81)
RDW: 15.9 % — ABNORMAL HIGH (ref 11.5–15.5)
WBC: 8 10*3/uL (ref 4.0–10.5)

## 2017-06-20 NOTE — Progress Notes (Signed)
PCP - Arlyce Dice MD Cardiologist - denies  EKG - 06/20/17 Stress Test - denies ECHO - denies Cardiac Cath - denies  Aspirin Instructions: per surgeon  Patient denies shortness of breath, fever, cough and chest pain at PAT appointment  Patient verbalized understanding of instructions that were given to them at the PAT appointment. Patient was also instructed that they will need to review over the PAT instructions again at home before surgery.

## 2017-06-20 NOTE — Pre-Procedure Instructions (Signed)
[name].  06/20/2017      [facility]- [place], [place] - [address] [place] [address] Phone: [phone] Fax: [phone]  [facility]- [place], [place] - [place], [place] - [address] [place] [address] Phone: [phone] Fax: [phone]    Your procedure is scheduled on June 25, 2017  Report to [facility] Admitting at 1:00 P.M.  Call this number if you have problems the morning of surgery:  [phone]   Remember:  No food after midnight.                        Take these medicines the morning of surgery with A SIP OF WATER:   Amlodipine Atorvastatin Gabapentin Flonase or Nasonex Zyrtec Eye Drops Pain Pill as needed (Hydrocodone)  Follow your doctors instructions regarding your Aspirin.  If no instructions were given by your doctor, then you will need to call the prescribing office office to get instructions.    7 days prior to surgery STOP taking any Aspirin(unless otherwise instructed by your surgeon), Aleve, Naproxen, Ibuprofen, Motrin, Advil, Goody's, BC's, all herbal medications, fish oil, and all vitamins     Do not wear jewelry.  Do not wear lotions, powders, or perfumes, or deodorant.  Do not shave 48 hours prior to surgery.  Men may shave face and neck.  Do not bring valuables to the hospital.  [facility] is not responsible for any belongings or valuables.  Contacts, dentures or bridgework may not be worn into surgery.  Leave your suitcase in the car.  After surgery it may be brought to your room.  For patients admitted to the hospital, discharge time will be determined by your treatment team.  Patients discharged the day of surgery will not be allowed to drive home, and must have an adult present for 24 hours after surgery.  Name and phone number of your driver:   Special instructions:    [place]- Preparing For  Surgery  Before surgery, you can play an important role. Because skin is not sterile, your skin needs to be as free of germs as possible. You can reduce the number of germs on your skin by washing with CHG (chlorahexidine gluconate) Soap before surgery.  CHG is an antiseptic cleaner which kills germs and bonds with the skin to continue killing germs even after washing.    Oral Hygiene is also important to reduce your risk of infection.  Remember - BRUSH YOUR TEETH THE MORNING OF SURGERY WITH YOUR REGULAR TOOTHPASTE  Please do not use if you have an allergy to CHG or antibacterial soaps. If your skin becomes reddened/irritated stop using the CHG.  Do not shave (including legs and underarms) for at least 48 hours prior to first CHG shower. It is OK to shave your face.  Please follow these instructions carefully.   1. Shower the NIGHT BEFORE SURGERY and the MORNING OF SURGERY with CHG.   2. If you chose to wash your hair, wash your hair first as usual with your normal shampoo.  3. After you shampoo, rinse your hair and body thoroughly to remove the shampoo.  4. Use CHG as you would any other liquid soap. You can apply CHG directly to the skin and wash gently with a scrungie or a clean washcloth.   5. Apply the CHG Soap to your body ONLY FROM  THE NECK DOWN.  Do not use on open wounds or open sores. Avoid contact with your eyes, ears, mouth and genitals (private parts). Wash Face and genitals (private parts)  with your normal soap.  6. Wash thoroughly, paying special attention to the area where your surgery will be performed.  7. Thoroughly rinse your body with warm water from the neck down.  8. DO NOT shower/wash with your normal soap after using and rinsing off the CHG Soap.  9. Pat yourself dry with a CLEAN TOWEL.  10. Wear CLEAN PAJAMAS to bed the night before surgery, wear comfortable clothes the morning of surgery  11. Place CLEAN SHEETS on your bed the night of your first shower and  DO NOT SLEEP WITH PETS.    Day of Surgery:  Do not apply any deodorants/lotions.  Please wear clean clothes to the hospital/surgery center.   Remember to brush your teeth WITH YOUR REGULAR TOOTHPASTE.    Please read over the following fact sheets that you were given. Pain Booklet, Coughing and Deep Breathing and Surgical Site Infection Prevention

## 2017-06-22 MED ORDER — DEXTROSE 5 % IV SOLN
3.0000 g | INTRAVENOUS | Status: AC
  Administered 2017-06-25: 3 g via INTRAVENOUS
  Filled 2017-06-22: qty 3

## 2017-06-25 ENCOUNTER — Encounter (HOSPITAL_COMMUNITY): Payer: Self-pay | Admitting: *Deleted

## 2017-06-25 ENCOUNTER — Ambulatory Visit (HOSPITAL_COMMUNITY): Payer: Medicare Other | Admitting: Certified Registered"

## 2017-06-25 ENCOUNTER — Encounter (HOSPITAL_COMMUNITY)
Admission: RE | Disposition: A | Discharge status: Home / Self Care | Payer: Self-pay | Source: Ambulatory Visit | Attending: Orthopaedic Surgery

## 2017-06-25 ENCOUNTER — Ambulatory Visit (HOSPITAL_COMMUNITY)
Admission: RE | Admit: 2017-06-25 | Discharge: 2017-06-25 | Disposition: A | Discharge status: Home / Self Care | Payer: Medicare Other | Source: Ambulatory Visit | Attending: Orthopaedic Surgery | Admitting: Orthopaedic Surgery

## 2017-06-25 DIAGNOSIS — B1920 Unspecified viral hepatitis C without hepatic coma: Secondary | ICD-10-CM | POA: Insufficient documentation

## 2017-06-25 DIAGNOSIS — F419 Anxiety disorder, unspecified: Secondary | ICD-10-CM | POA: Insufficient documentation

## 2017-06-25 DIAGNOSIS — G5601 Carpal tunnel syndrome, right upper limb: Secondary | ICD-10-CM | POA: Diagnosis not present

## 2017-06-25 DIAGNOSIS — Z7982 Long term (current) use of aspirin: Secondary | ICD-10-CM | POA: Diagnosis not present

## 2017-06-25 DIAGNOSIS — Z79899 Other long term (current) drug therapy: Secondary | ICD-10-CM | POA: Diagnosis not present

## 2017-06-25 DIAGNOSIS — I10 Essential (primary) hypertension: Secondary | ICD-10-CM | POA: Insufficient documentation

## 2017-06-25 DIAGNOSIS — E785 Hyperlipidemia, unspecified: Secondary | ICD-10-CM | POA: Diagnosis not present

## 2017-06-25 DIAGNOSIS — Z6839 Body mass index (BMI) 39.0-39.9, adult: Secondary | ICD-10-CM | POA: Diagnosis not present

## 2017-06-25 HISTORY — PX: CARPAL TUNNEL RELEASE: SHX101

## 2017-06-25 SURGERY — CARPAL TUNNEL RELEASE
Anesthesia: General | Site: Wrist | Laterality: Right

## 2017-06-25 MED ORDER — HYDROCODONE-ACETAMINOPHEN 5-325 MG PO TABS: 1.0000 | ORAL_TABLET | Freq: Four times a day (QID) | ORAL | 0 refills | Status: DC | PRN

## 2017-06-25 MED ORDER — CHLORHEXIDINE GLUCONATE 4 % EX LIQD: 60.0000 mL | Freq: Once | CUTANEOUS | Status: DC

## 2017-06-25 MED ORDER — FENTANYL CITRATE (PF) 100 MCG/2ML IJ SOLN
INTRAMUSCULAR | Status: DC | PRN
  Administered 2017-06-25 (×2): 50 ug via INTRAVENOUS

## 2017-06-25 MED ORDER — DEXAMETHASONE SODIUM PHOSPHATE 10 MG/ML IJ SOLN
INTRAMUSCULAR | Status: DC | PRN
  Administered 2017-06-25: 10 mg via INTRAVENOUS

## 2017-06-25 MED ORDER — BUPIVACAINE-EPINEPHRINE (PF) 0.25% -1:200000 IJ SOLN
INTRAMUSCULAR | Status: AC
  Filled 2017-06-25: qty 30

## 2017-06-25 MED ORDER — FENTANYL CITRATE (PF) 100 MCG/2ML IJ SOLN: 25.0000 ug | INTRAMUSCULAR | Status: DC | PRN

## 2017-06-25 MED ORDER — LIDOCAINE HCL (CARDIAC) PF 100 MG/5ML IV SOSY
PREFILLED_SYRINGE | INTRAVENOUS | Status: DC | PRN
  Administered 2017-06-25: 80 mg via INTRAVENOUS

## 2017-06-25 MED ORDER — EPHEDRINE SULFATE 50 MG/ML IJ SOLN
INTRAMUSCULAR | Status: DC | PRN
  Administered 2017-06-25: 10 mg via INTRAVENOUS

## 2017-06-25 MED ORDER — 0.9 % SODIUM CHLORIDE (POUR BTL) OPTIME
TOPICAL | Status: DC | PRN
  Administered 2017-06-25: 1000 mL

## 2017-06-25 MED ORDER — DEXAMETHASONE SODIUM PHOSPHATE 10 MG/ML IJ SOLN
INTRAMUSCULAR | Status: AC
  Filled 2017-06-25: qty 1

## 2017-06-25 MED ORDER — FENTANYL CITRATE (PF) 250 MCG/5ML IJ SOLN
INTRAMUSCULAR | Status: AC
  Filled 2017-06-25: qty 5

## 2017-06-25 MED ORDER — PROPOFOL 10 MG/ML IV BOLUS
INTRAVENOUS | Status: DC | PRN
  Administered 2017-06-25: 160 mg via INTRAVENOUS
  Administered 2017-06-25: 40 mg via INTRAVENOUS
  Administered 2017-06-25: 50 mg via INTRAVENOUS

## 2017-06-25 MED ORDER — ONDANSETRON HCL 4 MG/2ML IJ SOLN
INTRAMUSCULAR | Status: DC | PRN
  Administered 2017-06-25: 4 mg via INTRAVENOUS

## 2017-06-25 MED ORDER — BUPIVACAINE-EPINEPHRINE (PF) 0.25% -1:200000 IJ SOLN
INTRAMUSCULAR | Status: DC | PRN
  Administered 2017-06-25: 10 mL

## 2017-06-25 MED ORDER — MIDAZOLAM HCL 2 MG/2ML IJ SOLN
INTRAMUSCULAR | Status: AC
  Filled 2017-06-25: qty 2

## 2017-06-25 MED ORDER — MIDAZOLAM HCL 5 MG/5ML IJ SOLN
INTRAMUSCULAR | Status: DC | PRN
  Administered 2017-06-25: 2 mg via INTRAVENOUS

## 2017-06-25 MED ORDER — PHENYLEPHRINE 40 MCG/ML (10ML) SYRINGE FOR IV PUSH (FOR BLOOD PRESSURE SUPPORT)
PREFILLED_SYRINGE | INTRAVENOUS | Status: AC
  Filled 2017-06-25: qty 10

## 2017-06-25 MED ORDER — OXYCODONE HCL 5 MG/5ML PO SOLN: 5.0000 mg | Freq: Once | ORAL | Status: DC | PRN

## 2017-06-25 MED ORDER — LACTATED RINGERS IV SOLN
INTRAVENOUS | Status: DC
  Administered 2017-06-25: 14:00:00 via INTRAVENOUS

## 2017-06-25 MED ORDER — ONDANSETRON HCL 4 MG/2ML IJ SOLN
INTRAMUSCULAR | Status: AC
  Filled 2017-06-25: qty 2

## 2017-06-25 MED ORDER — OXYCODONE HCL 5 MG PO TABS: 5.0000 mg | ORAL_TABLET | Freq: Once | ORAL | Status: DC | PRN

## 2017-06-25 MED ORDER — PROPOFOL 10 MG/ML IV BOLUS
INTRAVENOUS | Status: AC
  Filled 2017-06-25: qty 20

## 2017-06-25 SURGICAL SUPPLY — 40 items
BANDAGE ACE 3X5.8 VEL STRL LF (GAUZE/BANDAGES/DRESSINGS) ×2 IMPLANT
BNDG CMPR 9X4 STRL LF SNTH (GAUZE/BANDAGES/DRESSINGS) ×1
BNDG CONFORM 3 STRL LF (GAUZE/BANDAGES/DRESSINGS) IMPLANT
BNDG ESMARK 4X9 LF (GAUZE/BANDAGES/DRESSINGS) ×3 IMPLANT
CORDS BIPOLAR (ELECTRODE) ×2 IMPLANT
COVER SURGICAL LIGHT HANDLE (MISCELLANEOUS) ×3 IMPLANT
CUFF TOURNIQUET SINGLE 18IN (TOURNIQUET CUFF) ×2 IMPLANT
DRAPE SURG 17X23 STRL (DRAPES) IMPLANT
DURAPREP 26ML APPLICATOR (WOUND CARE) ×3 IMPLANT
ELECT REM PT RETURN 9FT ADLT (ELECTROSURGICAL) ×3
ELECTRODE REM PT RTRN 9FT ADLT (ELECTROSURGICAL) IMPLANT
GAUZE SPONGE 4X4 12PLY STRL (GAUZE/BANDAGES/DRESSINGS) ×5 IMPLANT
GAUZE XEROFORM 1X8 LF (GAUZE/BANDAGES/DRESSINGS) ×3 IMPLANT
GLOVE BIOGEL PI IND STRL 7.0 (GLOVE) ×2 IMPLANT
GLOVE BIOGEL PI INDICATOR 7.0 (GLOVE) ×4
GLOVE ECLIPSE 7.0 STRL STRAW (GLOVE) ×6 IMPLANT
GLOVE SKINSENSE NS SZ7.5 (GLOVE) ×4
GLOVE SKINSENSE STRL SZ7.5 (GLOVE) ×2 IMPLANT
GOWN STRL REIN XL XLG (GOWN DISPOSABLE) ×6 IMPLANT
GOWN STRL REUS W/ TWL LRG LVL3 (GOWN DISPOSABLE) ×1 IMPLANT
GOWN STRL REUS W/TWL LRG LVL3 (GOWN DISPOSABLE) ×3
KIT BASIN OR (CUSTOM PROCEDURE TRAY) ×3 IMPLANT
KIT TURNOVER KIT B (KITS) ×3 IMPLANT
NEEDLE 22X1 1/2 (OR ONLY) (NEEDLE) ×2 IMPLANT
NS IRRIG 1000ML POUR BTL (IV SOLUTION) ×6 IMPLANT
PACK ORTHO EXTREMITY (CUSTOM PROCEDURE TRAY) ×3 IMPLANT
PAD ARMBOARD 7.5X6 YLW CONV (MISCELLANEOUS) ×3 IMPLANT
PADDING CAST ABS 4INX4YD NS (CAST SUPPLIES) ×4
PADDING CAST ABS COTTON 4X4 ST (CAST SUPPLIES) ×2 IMPLANT
PADDING UNDERCAST 2 STRL (CAST SUPPLIES) ×2
PADDING UNDERCAST 2X4 STRL (CAST SUPPLIES) IMPLANT
SUT ETHILON 3 0 FSL (SUTURE) ×2 IMPLANT
SUT ETHILON 4 0 PS 2 18 (SUTURE) ×3 IMPLANT
SYR CONTROL 10ML LL (SYRINGE) ×2 IMPLANT
TOWEL OR 17X24 6PK STRL BLUE (TOWEL DISPOSABLE) ×3 IMPLANT
TOWEL OR 17X26 10 PK STRL BLUE (TOWEL DISPOSABLE) ×3 IMPLANT
TUBE CONNECTING 12'X1/4 (SUCTIONS) ×1
TUBE CONNECTING 12X1/4 (SUCTIONS) ×1 IMPLANT
UNDERPAD 30X30 (UNDERPADS AND DIAPERS) ×6 IMPLANT
WATER STERILE IRR 1000ML POUR (IV SOLUTION) ×3 IMPLANT

## 2017-06-25 NOTE — Anesthesia Procedure Notes (Signed)
Procedure Name: LMA Insertion Date/Time: 06/25/2017 4:30 PM Performed by: Rosiland OzMeyers, Jeanpierre Thebeau, CRNA Pre-anesthesia Checklist: Patient identified, Emergency Drugs available, Suction available, Patient being monitored and Timeout performed Patient Re-evaluated:Patient Re-evaluated prior to induction Oxygen Delivery Method: Circle system utilized Preoxygenation: Pre-oxygenation with 100% oxygen Induction Type: IV induction LMA: LMA inserted LMA Size: 5.0 Number of attempts: 1 Placement Confirmation: positive ETCO2 and breath sounds checked- equal and bilateral Tube secured with: Tape Dental Injury: Teeth and Oropharynx as per pre-operative assessment

## 2017-06-25 NOTE — Anesthesia Preprocedure Evaluation (Signed)
Anesthesia Evaluation  Patient identified by MRN, date of birth, ID band Patient awake    Reviewed: Allergy & Precautions, NPO status , Patient's Chart, lab work & pertinent test results  History of Anesthesia Complications Negative for: history of anesthetic complications  Airway Mallampati: II  TM Distance: >3 FB Neck ROM: Full    Dental  (+) Missing,    Pulmonary neg pulmonary ROS,    breath sounds clear to auscultation       Cardiovascular hypertension, Pt. on medications  Rhythm:Regular     Neuro/Psych PSYCHIATRIC DISORDERS Anxiety Depression  Neuromuscular disease    GI/Hepatic negative GI ROS, (+) Hepatitis -  Endo/Other  diabetes, Type obesity  Renal/GU      Musculoskeletal  (+) Arthritis ,   Abdominal   Peds  Hematology   Anesthesia Other Findings   Reproductive/Obstetrics                             Anesthesia Physical Anesthesia Plan  ASA: II  Anesthesia Plan: General   Post-op Pain Management:    Induction: Intravenous  PONV Risk Score and Plan: 2 and Ondansetron and Dexamethasone  Airway Management Planned: LMA  Additional Equipment: None  Intra-op Plan:   Post-operative Plan: Extubation in OR  Informed Consent: I have reviewed the patients History and Physical, chart, labs and discussed the procedure including the risks, benefits and alternatives for the proposed anesthesia with the patient or authorized representative who has indicated his/her understanding and acceptance.   Dental advisory given  Plan Discussed with: CRNA and Surgeon  Anesthesia Plan Comments:         Anesthesia Quick Evaluation

## 2017-06-25 NOTE — Discharge Instructions (Signed)
Postoperative instructions: ° °Weightbearing instructions: no heavy lifting for 4 weeks ° °Dressing instructions: Keep your dressing and/or splint clean and dry at all times.  It will be removed at your first post-operative appointment.  Your stitches and/or staples will be removed at this visit. ° °Incision instructions:  Do not soak your incision for 3 weeks after surgery.  If the incision gets wet, pat dry and do not scrub the incision. ° °Pain control:  You have been given a prescription to be taken as directed for post-operative pain control.  In addition, elevate the operative extremity above the heart at all times to prevent swelling and throbbing pain. ° °Take over-the-counter Colace, 100mg by mouth twice a day while taking narcotic pain medications to help prevent constipation. ° °Follow up appointments: °1) 10-14 days for suture removal and wound check. °2) Dr. Partick Musselman as scheduled. ° ° ------------------------------------------------------------------------------------------------------------- ° °After Surgery Pain Control: ° °After your surgery, post-surgical discomfort or pain is likely. This discomfort can last several days to a few weeks. At certain times of the day your discomfort may be more intense.  °Did you receive a nerve block?  °A nerve block can provide pain relief for one hour to two days after your surgery. As long as the nerve block is working, you will experience little or no sensation in the area the surgeon operated on.  °As the nerve block wears off, you will begin to experience pain or discomfort. It is very important that you begin taking your prescribed pain medication before the nerve block fully wears off. Treating your pain at the first sign of the block wearing off will ensure your pain is better controlled and more tolerable when full-sensation returns. Do not wait until the pain is intolerable, as the medicine will be less effective. It is better to treat pain in advance than to  try and catch up.  °General Anesthesia:  °If you did not receive a nerve block during your surgery, you will need to start taking your pain medication shortly after your surgery and should continue to do so as prescribed by your surgeon.  °Pain Medication:  °Most commonly we prescribe Vicodin and Percocet for post-operative pain. Both of these medications contain a combination of acetaminophen (Tylenol®) and a narcotic to help control pain.  °· It takes between 30 and 45 minutes before pain medication starts to work. It is important to take your medication before your pain level gets too intense.  °· Nausea is a common side effect of many pain medications. You will want to eat something before taking your pain medicine to help prevent nausea.  °· If you are taking a prescription pain medication that contains acetaminophen, we recommend that you do not take additional over the counter acetaminophen (Tylenol®).  °Other pain relieving options:  °· Using a cold pack to ice the affected area a few times a day (15 to 20 minutes at a time) can help to relieve pain, reduce swelling and bruising.  °· Elevation of the affected area can also help to reduce pain and swelling. ° ° ° °

## 2017-06-25 NOTE — Op Note (Signed)
   Carpal tunnel op note  DATE OF SURGERY:06/25/2017  PREOPERATIVE DIAGNOSIS:  Right Carpal tunnel syndrome  POSTOPERATIVE DIAGNOSIS: same  PROCEDURE:  Right carpal tunnel release. CPT [phone]  SURGEON: Surgeon(s): Tarry KosXu, Darwin Guastella M, MD  ASSIST: Oneal GroutMary Lindsey Stanbery, PA-C; necessary for the timely completion of procedure and due to complexity of procedure.  ANESTHESIA:  General  TOURNIQUET TIME: less than 10 minutes  BLOOD LOSS: Minimal.  COMPLICATIONS: None.  PATHOLOGY: None.  INDICATIONS: The patient is a 52 y.o. -year-old male who presented with carpal tunnel syndrome failing nonsurgical management, indicated for surgical release.  DESCRIPTION OF PROCEDURE: The patient was identified in the preoperative holding area.  The operative site was marked by the surgeon and confirmed by the patient.  He was brought back to the operating room.  Anesthesia was induced by the anesthesia team.  A well padded nonsterile tourniquet was placed. The operative extremity was prepped and draped in standard sterile fashion.  A timeout was performed.  Preoperative antibiotics were given.   A palmar incision was made about 5 mm ulnar to the thenar crease.  The palmar aponeurosis was exposed and divided in line with the skin incision. The palmaris brevis was visualized and divided.  The distal edge of the transcarpal ligament was identified. A hemostat was inserted into the carpal tunnel to protect the median nerve and the flexor tendons. Then, the transverse carpal ligament was released under direct visualization. Proximally, a subcutaneous tunnel was made allowing a Sewell retractor to be placed. Then, the distal portion of the antebrachial fascia was released. Distally, all fibrous bands were released. The median nerve was visualized, and the fat pad was exposed. Following release, local infiltration with 0.25% of Sensorcaine was given. The tourniquet was deflated. Hemostasis achieved.  Wound was irrigated and  closed with 4-0 nylon sutures. Sterile dressing applied. The patient was transferred to the recovery room in stable condition after all counts were correct.  POSTOPERATIVE PLAN: To start nerve gliding exercises as tolerated and no heavy lifting for four weeks.
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
## 2017-06-25 NOTE — Transfer of Care (Signed)
Immediate Anesthesia Transfer of Care Note  Patient: Kevin FaceJoshua Mander Jr.  Procedure(s) Performed: RIGHT CARPAL TUNNEL RELEASE (Right Wrist)  Patient Location: PACU  Anesthesia Type:General  Level of Consciousness: awake and patient cooperative  Airway & Oxygen Therapy: Patient Spontanous Breathing  Post-op Assessment: Report given to RN and Post -op Vital signs reviewed and stable  Post vital signs: Reviewed and stable  Last Vitals:  Vitals Value Taken Time  BP 121/73 06/25/2017  5:07 PM  Temp    Pulse 91 06/25/2017  5:09 PM  Resp 22 06/25/2017  5:09 PM  SpO2 92 % 06/25/2017  5:09 PM  Vitals shown include unvalidated device data.  Last Pain:  Vitals:   06/25/17 1322  TempSrc:   PainSc: 10-Worst pain ever      Patients Stated Pain Goal: 2 (06/25/17 1322)  Complications: No apparent anesthesia complications

## 2017-06-26 ENCOUNTER — Encounter (HOSPITAL_COMMUNITY): Payer: Self-pay | Admitting: Orthopaedic Surgery

## 2017-06-29 NOTE — Anesthesia Postprocedure Evaluation (Signed)
Anesthesia Post Note  Patient: Kevin FaceJoshua Ventresca Jr.  Procedure(s) Performed: RIGHT CARPAL TUNNEL RELEASE (Right Wrist)     Patient location during evaluation: PACU Anesthesia Type: General Level of consciousness: awake and alert Pain management: pain level controlled Vital Signs Assessment: post-procedure vital signs reviewed and stable Respiratory status: spontaneous breathing, nonlabored ventilation, respiratory function stable and patient connected to nasal cannula oxygen Cardiovascular status: blood pressure returned to baseline and stable Postop Assessment: no apparent nausea or vomiting Anesthetic complications: no    Last Vitals:  Vitals:   06/25/17 1722 06/25/17 1731  BP: 120/78 117/74  Pulse: 80 82  Resp: 15 12  Temp:    SpO2: 97% 97%    Last Pain:  Vitals:   06/25/17 1731  TempSrc:   PainSc: 0-No pain                 Kevin Jennings

## 2017-07-02 ENCOUNTER — Telehealth (INDEPENDENT_AMBULATORY_CARE_PROVIDER_SITE_OTHER): Payer: Self-pay | Admitting: Orthopaedic Surgery

## 2017-07-02 NOTE — Telephone Encounter (Signed)
Patient called saying that the oxycodone isn't helping with the pain and he would like something stronger. CB # [phone]

## 2017-07-02 NOTE — Telephone Encounter (Signed)
See below

## 2017-07-03 NOTE — Telephone Encounter (Signed)
Patient aware he can't have anything stronger  He states he;s about to walk in for someone to check his bandage

## 2017-07-03 NOTE — Telephone Encounter (Signed)
Patient called again to check on rx, I advised that Dr. Roda ShuttersXu was in surgery yesterday and he would get a call once addressed. Patient stated "I'll be up there in a minute" and hung up the phone.

## 2017-07-03 NOTE — Telephone Encounter (Signed)
We don't prescribe anything stronger than oxycodone.  I'm happy to prescribe him gabapentin 300 mg TID if he hasn't tried that before

## 2017-07-03 NOTE — Telephone Encounter (Signed)
See message.

## 2017-07-10 ENCOUNTER — Telehealth (INDEPENDENT_AMBULATORY_CARE_PROVIDER_SITE_OTHER): Payer: Self-pay

## 2017-07-10 ENCOUNTER — Encounter (INDEPENDENT_AMBULATORY_CARE_PROVIDER_SITE_OTHER): Payer: Self-pay | Admitting: Orthopaedic Surgery

## 2017-07-10 ENCOUNTER — Ambulatory Visit (INDEPENDENT_AMBULATORY_CARE_PROVIDER_SITE_OTHER): Payer: Medicare Other | Admitting: Orthopaedic Surgery

## 2017-07-10 DIAGNOSIS — G5602 Carpal tunnel syndrome, left upper limb: Secondary | ICD-10-CM

## 2017-07-10 DIAGNOSIS — G5601 Carpal tunnel syndrome, right upper limb: Secondary | ICD-10-CM

## 2017-07-10 MED ORDER — HYDROCODONE-ACETAMINOPHEN 5-325 MG PO TABS: 1.0000 | ORAL_TABLET | Freq: Every day | ORAL | 0 refills | Status: DC | PRN

## 2017-07-10 NOTE — Telephone Encounter (Signed)
Submitted application online for Monovisc, left knee. 

## 2017-07-10 NOTE — Telephone Encounter (Signed)
Please submit for Gel Inj Monovisc for Left knee. I do not see anything. This must have gotten missed. Thank you

## 2017-07-10 NOTE — Telephone Encounter (Signed)
Noted  

## 2017-07-10 NOTE — Progress Notes (Signed)
Mr. Kevin Jennings is two-week status post right carpal tunnel release.  He is doing well.  His pain is significantly better.  He still has little bit of numbness in his median nerve distribution mainly in the thumb and index fingertips.  He has full use of his hand.  Today we remove the sutures.  Patient would like to get his left carpal tunnel release scheduled in the near future.  We went ahead and did that today for sometime in July.  I will recheck his right hand at that time.

## 2017-07-23 ENCOUNTER — Telehealth (INDEPENDENT_AMBULATORY_CARE_PROVIDER_SITE_OTHER): Payer: Self-pay

## 2017-07-23 NOTE — Telephone Encounter (Signed)
[facility] [place] M[place] 07/20/17. Pending [id number]

## 2017-07-31 ENCOUNTER — Encounter (HOSPITAL_BASED_OUTPATIENT_CLINIC_OR_DEPARTMENT_OTHER): Payer: Self-pay | Admitting: *Deleted

## 2017-07-31 ENCOUNTER — Other Ambulatory Visit: Payer: Self-pay

## 2017-07-31 NOTE — Progress Notes (Signed)
Pt to come Thursday for BMET. Bring all medications day of surgery.

## 2017-08-01 ENCOUNTER — Telehealth (INDEPENDENT_AMBULATORY_CARE_PROVIDER_SITE_OTHER): Payer: Self-pay

## 2017-08-01 NOTE — Telephone Encounter (Signed)
Called and left VM for patient to return call to schedule appointment for gel injection.  Patient approved for Monovisc, left knee. Buy & Bill Patient will be responsible for 20% OOP. No Co-pay PA Approved- Approval# [id number] Valid 07/23/2017 - 12/32/2019

## 2017-08-02 ENCOUNTER — Encounter (HOSPITAL_BASED_OUTPATIENT_CLINIC_OR_DEPARTMENT_OTHER)
Admission: RE | Admit: 2017-08-02 | Discharge: 2017-08-02 | Disposition: A | Discharge status: Home / Self Care | Payer: Medicare Other | Source: Ambulatory Visit | Attending: Orthopaedic Surgery | Admitting: Orthopaedic Surgery

## 2017-08-02 DIAGNOSIS — Z01812 Encounter for preprocedural laboratory examination: Secondary | ICD-10-CM | POA: Diagnosis not present

## 2017-08-02 LAB — BASIC METABOLIC PANEL
ANION GAP: 8 (ref 5–15)
BUN: 8 mg/dL (ref 6–20)
CALCIUM: 9.6 mg/dL (ref 8.9–10.3)
CHLORIDE: 109 mmol/L (ref 98–111)
CO2: 26 mmol/L (ref 22–32)
Creatinine, Ser: 1.16 mg/dL (ref 0.61–1.24)
GFR calc non Af Amer: >60 mL/min (ref >60)
GLUCOSE: 125 mg/dL — AB (ref 70–99)
Potassium: 4.2 mmol/L (ref 3.5–5.1)
Sodium: 143 mmol/L (ref 135–145)

## 2017-08-07 ENCOUNTER — Encounter (INDEPENDENT_AMBULATORY_CARE_PROVIDER_SITE_OTHER): Payer: Self-pay | Admitting: Orthopaedic Surgery

## 2017-08-07 ENCOUNTER — Ambulatory Visit (INDEPENDENT_AMBULATORY_CARE_PROVIDER_SITE_OTHER): Payer: Medicare Other | Admitting: Orthopaedic Surgery

## 2017-08-07 DIAGNOSIS — M1712 Unilateral primary osteoarthritis, left knee: Secondary | ICD-10-CM | POA: Diagnosis not present

## 2017-08-07 MED ORDER — HYALURONAN 88 MG/4ML IX SOSY
88.0000 mg | PREFILLED_SYRINGE | INTRA_ARTICULAR | Status: AC | PRN
  Administered 2017-08-07: 88 mg via INTRA_ARTICULAR

## 2017-08-07 NOTE — Progress Notes (Signed)
   Procedure Note  Patient: Kevin FaceJoshua Santillo Jr.             Date of Birth: [birth year]           MRN: [id number]             Visit Date: 08/07/2017  Procedures: Visit Diagnoses: Unilateral primary osteoarthritis, left knee  Large Joint Inj: L knee on 08/07/2017 8:21 AM Indications: pain Details: 22 G needle  Arthrogram: No  Medications: 88 mg Hyaluronan 88 MG/4ML Outcome: tolerated well, no immediate complications Patient was prepped and draped in the usual sterile fashion.

## 2017-08-08 ENCOUNTER — Ambulatory Visit (HOSPITAL_BASED_OUTPATIENT_CLINIC_OR_DEPARTMENT_OTHER): Payer: Medicare Other | Admitting: Anesthesiology

## 2017-08-08 ENCOUNTER — Encounter (HOSPITAL_BASED_OUTPATIENT_CLINIC_OR_DEPARTMENT_OTHER): Payer: Self-pay | Admitting: Anesthesiology

## 2017-08-08 ENCOUNTER — Ambulatory Visit (HOSPITAL_BASED_OUTPATIENT_CLINIC_OR_DEPARTMENT_OTHER)
Admission: RE | Admit: 2017-08-08 | Discharge: 2017-08-08 | Disposition: A | Discharge status: Home / Self Care | Payer: Medicare Other | Source: Ambulatory Visit | Attending: Orthopaedic Surgery | Admitting: Orthopaedic Surgery

## 2017-08-08 ENCOUNTER — Encounter (HOSPITAL_BASED_OUTPATIENT_CLINIC_OR_DEPARTMENT_OTHER)
Admission: RE | Disposition: A | Discharge status: Home / Self Care | Payer: Self-pay | Source: Ambulatory Visit | Attending: Orthopaedic Surgery

## 2017-08-08 ENCOUNTER — Other Ambulatory Visit: Payer: Self-pay

## 2017-08-08 DIAGNOSIS — F329 Major depressive disorder, single episode, unspecified: Secondary | ICD-10-CM | POA: Insufficient documentation

## 2017-08-08 DIAGNOSIS — F419 Anxiety disorder, unspecified: Secondary | ICD-10-CM | POA: Diagnosis not present

## 2017-08-08 DIAGNOSIS — E785 Hyperlipidemia, unspecified: Secondary | ICD-10-CM | POA: Diagnosis not present

## 2017-08-08 DIAGNOSIS — Z79899 Other long term (current) drug therapy: Secondary | ICD-10-CM | POA: Diagnosis not present

## 2017-08-08 DIAGNOSIS — G5601 Carpal tunnel syndrome, right upper limb: Secondary | ICD-10-CM | POA: Diagnosis not present

## 2017-08-08 DIAGNOSIS — G5602 Carpal tunnel syndrome, left upper limb: Secondary | ICD-10-CM | POA: Diagnosis present

## 2017-08-08 DIAGNOSIS — Z7982 Long term (current) use of aspirin: Secondary | ICD-10-CM | POA: Insufficient documentation

## 2017-08-08 DIAGNOSIS — I10 Essential (primary) hypertension: Secondary | ICD-10-CM | POA: Diagnosis not present

## 2017-08-08 HISTORY — PX: CARPAL TUNNEL RELEASE: SHX101

## 2017-08-08 SURGERY — CARPAL TUNNEL RELEASE
Anesthesia: General | Site: Wrist | Laterality: Left

## 2017-08-08 MED ORDER — DEXAMETHASONE SODIUM PHOSPHATE 4 MG/ML IJ SOLN
INTRAMUSCULAR | Status: DC | PRN
  Administered 2017-08-08: 10 mg via INTRAVENOUS

## 2017-08-08 MED ORDER — CHLORHEXIDINE GLUCONATE 4 % EX LIQD: 60.0000 mL | Freq: Once | CUTANEOUS | Status: DC

## 2017-08-08 MED ORDER — CEFAZOLIN SODIUM-DEXTROSE 2-4 GM/100ML-% IV SOLN
2.0000 g | INTRAVENOUS | Status: AC
  Administered 2017-08-08: 3 g via INTRAVENOUS

## 2017-08-08 MED ORDER — BUPIVACAINE HCL (PF) 0.25 % IJ SOLN
INTRAMUSCULAR | Status: AC
Start: 2017-08-08 — End: ?
  Filled 2017-08-08: qty 60

## 2017-08-08 MED ORDER — PROPOFOL 500 MG/50ML IV EMUL
INTRAVENOUS | Status: AC
  Filled 2017-08-08: qty 50

## 2017-08-08 MED ORDER — CEFAZOLIN SODIUM-DEXTROSE 2-4 GM/100ML-% IV SOLN
INTRAVENOUS | Status: AC
  Filled 2017-08-08: qty 100

## 2017-08-08 MED ORDER — HYDROCODONE-ACETAMINOPHEN 5-325 MG PO TABS: 1.0000 | ORAL_TABLET | Freq: Two times a day (BID) | ORAL | 0 refills | Status: DC | PRN

## 2017-08-08 MED ORDER — FENTANYL CITRATE (PF) 100 MCG/2ML IJ SOLN: 25.0000 ug | INTRAMUSCULAR | Status: DC | PRN

## 2017-08-08 MED ORDER — FENTANYL CITRATE (PF) 100 MCG/2ML IJ SOLN
INTRAMUSCULAR | Status: AC
  Filled 2017-08-08: qty 2

## 2017-08-08 MED ORDER — MIDAZOLAM HCL 2 MG/2ML IJ SOLN
INTRAMUSCULAR | Status: AC
Start: 2017-08-08 — End: ?
  Filled 2017-08-08: qty 2

## 2017-08-08 MED ORDER — CEFAZOLIN SODIUM-DEXTROSE 1-4 GM/50ML-% IV SOLN
INTRAVENOUS | Status: AC
  Filled 2017-08-08: qty 50

## 2017-08-08 MED ORDER — ONDANSETRON HCL 4 MG/2ML IJ SOLN
INTRAMUSCULAR | Status: DC | PRN
  Administered 2017-08-08: 4 mg via INTRAVENOUS

## 2017-08-08 MED ORDER — MIDAZOLAM HCL 2 MG/2ML IJ SOLN
1.0000 mg | INTRAMUSCULAR | Status: DC | PRN
  Administered 2017-08-08: 2 mg via INTRAVENOUS

## 2017-08-08 MED ORDER — LACTATED RINGERS IV SOLN
INTRAVENOUS | Status: DC
  Administered 2017-08-08: 08:00:00 via INTRAVENOUS

## 2017-08-08 MED ORDER — LACTATED RINGERS IV SOLN: INTRAVENOUS | Status: DC

## 2017-08-08 MED ORDER — PHENYLEPHRINE HCL 10 MG/ML IJ SOLN
INTRAMUSCULAR | Status: DC | PRN
  Administered 2017-08-08: 80 ug via INTRAVENOUS

## 2017-08-08 MED ORDER — BUPIVACAINE HCL (PF) 0.25 % IJ SOLN
INTRAMUSCULAR | Status: DC | PRN
  Administered 2017-08-08: 10 mL

## 2017-08-08 MED ORDER — PROPOFOL 10 MG/ML IV BOLUS
INTRAVENOUS | Status: DC | PRN
  Administered 2017-08-08: 250 mg via INTRAVENOUS

## 2017-08-08 MED ORDER — FENTANYL CITRATE (PF) 100 MCG/2ML IJ SOLN
50.0000 ug | INTRAMUSCULAR | Status: DC | PRN
  Administered 2017-08-08: 100 ug via INTRAVENOUS

## 2017-08-08 MED ORDER — SCOPOLAMINE 1 MG/3DAYS TD PT72: 1.0000 | MEDICATED_PATCH | Freq: Once | TRANSDERMAL | Status: DC | PRN

## 2017-08-08 MED ORDER — ONDANSETRON HCL 4 MG/2ML IJ SOLN
INTRAMUSCULAR | Status: AC
  Filled 2017-08-08: qty 2

## 2017-08-08 MED ORDER — ONDANSETRON HCL 4 MG/2ML IJ SOLN: 4.0000 mg | Freq: Once | INTRAMUSCULAR | Status: DC | PRN

## 2017-08-08 SURGICAL SUPPLY — 54 items
BANDAGE ACE 3X5.8 VEL STRL LF (GAUZE/BANDAGES/DRESSINGS) ×3 IMPLANT
BLADE MINI RND TIP GREEN BEAV (BLADE) ×3 IMPLANT
BLADE SURG 15 STRL LF DISP TIS (BLADE) ×1 IMPLANT
BLADE SURG 15 STRL SS (BLADE) ×3
BNDG CMPR 9X4 STRL LF SNTH (GAUZE/BANDAGES/DRESSINGS) ×1
BNDG ESMARK 4X9 LF (GAUZE/BANDAGES/DRESSINGS) ×3 IMPLANT
BNDG PLASTER X FAST 3X3 WHT LF (CAST SUPPLIES) ×30 IMPLANT
BNDG PLSTR 9X3 FST ST WHT (CAST SUPPLIES) ×10
BRUSH SCRUB EZ PLAIN DRY (MISCELLANEOUS) ×3 IMPLANT
CANISTER SUCT 1200ML W/VALVE (MISCELLANEOUS) ×3 IMPLANT
CORD BIPOLAR FORCEPS 12FT (ELECTRODE) ×3 IMPLANT
COVER BACK TABLE 60X90IN (DRAPES) ×3 IMPLANT
COVER MAYO STAND STRL (DRAPES) ×3 IMPLANT
CUFF TOURNIQUET SINGLE 18IN (TOURNIQUET CUFF) ×2 IMPLANT
CUFF TOURNIQUET SINGLE 24IN (TOURNIQUET CUFF) ×2 IMPLANT
DECANTER SPIKE VIAL GLASS SM (MISCELLANEOUS) IMPLANT
DRAPE EXTREMITY T 121X128X90 (DRAPE) ×3 IMPLANT
DRAPE SURG 17X23 STRL (DRAPES) ×3 IMPLANT
GAUZE SPONGE 4X4 12PLY STRL (GAUZE/BANDAGES/DRESSINGS) ×3 IMPLANT
GAUZE SPONGE 4X4 16PLY XRAY LF (GAUZE/BANDAGES/DRESSINGS) IMPLANT
GAUZE XEROFORM 1X8 LF (GAUZE/BANDAGES/DRESSINGS) ×3 IMPLANT
GLOVE BIO SURGEON STRL SZ 6.5 (GLOVE) ×1 IMPLANT
GLOVE BIO SURGEONS STRL SZ 6.5 (GLOVE) ×1
GLOVE BIOGEL PI IND STRL 7.0 (GLOVE) ×1 IMPLANT
GLOVE BIOGEL PI IND STRL 8 (GLOVE) IMPLANT
GLOVE BIOGEL PI INDICATOR 7.0 (GLOVE) ×4
GLOVE BIOGEL PI INDICATOR 8 (GLOVE) ×2
GLOVE ECLIPSE 7.0 STRL STRAW (GLOVE) ×3 IMPLANT
GLOVE SKINSENSE NS SZ7.5 (GLOVE) ×2
GLOVE SKINSENSE STRL SZ7.5 (GLOVE) ×1 IMPLANT
GLOVE SURG SYN 7.5  E (GLOVE) ×2
GLOVE SURG SYN 7.5 E (GLOVE) ×1 IMPLANT
GLOVE SURG SYN 7.5 PF PI (GLOVE) ×1 IMPLANT
GOWN SRG XL LVL 4 BRTHBL STRL (GOWNS) ×1 IMPLANT
GOWN STRL NON-REIN XL LVL4 (GOWNS) ×3
GOWN STRL REIN XL XLG (GOWN DISPOSABLE) ×3 IMPLANT
GOWN STRL REUS W/ TWL XL LVL3 (GOWN DISPOSABLE) ×2 IMPLANT
GOWN STRL REUS W/TWL XL LVL3 (GOWN DISPOSABLE) ×6
NDL HYPO 25X1 1.5 SAFETY (NEEDLE) IMPLANT
NEEDLE HYPO 25X1 1.5 SAFETY (NEEDLE) ×3 IMPLANT
NS IRRIG 1000ML POUR BTL (IV SOLUTION) ×3 IMPLANT
PACK BASIN DAY SURGERY FS (CUSTOM PROCEDURE TRAY) ×3 IMPLANT
PAD CAST 3X4 CTTN HI CHSV (CAST SUPPLIES) ×1 IMPLANT
PADDING CAST COTTON 3X4 STRL (CAST SUPPLIES) ×3
RUBBERBAND STERILE (MISCELLANEOUS) ×6 IMPLANT
STOCKINETTE 4X48 STRL (DRAPES) ×3 IMPLANT
SUT ETHILON 3 0 PS 1 (SUTURE) ×3 IMPLANT
SYR BULB 3OZ (MISCELLANEOUS) ×3 IMPLANT
SYR CONTROL 10ML LL (SYRINGE) ×2 IMPLANT
TOWEL GREEN STERILE FF (TOWEL DISPOSABLE) ×3 IMPLANT
TRAY DSU PREP LF (CUSTOM PROCEDURE TRAY) ×3 IMPLANT
TUBE CONNECTING 20'X1/4 (TUBING) ×1
TUBE CONNECTING 20X1/4 (TUBING) ×2 IMPLANT
UNDERPAD 30X30 (UNDERPADS AND DIAPERS) ×3 IMPLANT

## 2017-08-08 NOTE — Anesthesia Preprocedure Evaluation (Addendum)
Anesthesia Evaluation  Patient identified by MRN, date of birth, ID band Patient awake    Reviewed: Allergy & Precautions, NPO status , Patient's Chart, lab work & pertinent test results  Airway Mallampati: II  TM Distance: >3 FB Neck ROM: Full    Dental  (+) Dental Advisory Given, Missing   Pulmonary neg pulmonary ROS,    Pulmonary exam normal breath sounds clear to auscultation       Cardiovascular hypertension, Pt. on medications Normal cardiovascular exam Rhythm:Regular Rate:Normal     Neuro/Psych PSYCHIATRIC DISORDERS Anxiety Depression  Neuromuscular disease    GI/Hepatic negative GI ROS, (+) Hepatitis -  Endo/Other  diabetes, Type 2Obesity   Renal/GU negative Renal ROS     Musculoskeletal  (+) Arthritis , Osteoarthritis,    Abdominal   Peds  Hematology  (+) Blood dyscrasia, anemia ,   Anesthesia Other Findings Day of surgery medications reviewed with the patient.  Reproductive/Obstetrics                            Anesthesia Physical Anesthesia Plan  ASA: II  Anesthesia Plan: MAC and Bier Block and Bier Block-LIDOCAINE ONLY   Post-op Pain Management:    Induction: Intravenous  PONV Risk Score and Plan: 1 and Propofol infusion and Midazolam  Airway Management Planned: Nasal Cannula and Natural Airway  Additional Equipment:   Intra-op Plan:   Post-operative Plan:   Informed Consent: I have reviewed the patients History and Physical, chart, labs and discussed the procedure including the risks, benefits and alternatives for the proposed anesthesia with the patient or authorized representative who has indicated his/her understanding and acceptance.   Dental advisory given  Plan Discussed with:   Anesthesia Plan Comments:         Anesthesia Quick Evaluation

## 2017-08-08 NOTE — Anesthesia Procedure Notes (Signed)
Procedure Name: LMA Insertion Date/Time: 08/08/2017 8:36 AM Performed by: Burna Cashonrad, Kellie Murrill C, CRNA Pre-anesthesia Checklist: Patient identified, Emergency Drugs available, Suction available and Patient being monitored Patient Re-evaluated:Patient Re-evaluated prior to induction Oxygen Delivery Method: Circle system utilized Preoxygenation: Pre-oxygenation with 100% oxygen Induction Type: IV induction Ventilation: Mask ventilation without difficulty LMA: LMA inserted LMA Size: 5.0 Number of attempts: 1 Airway Equipment and Method: Bite block Placement Confirmation: positive ETCO2 Tube secured with: Tape Dental Injury: Teeth and Oropharynx as per pre-operative assessment

## 2017-08-08 NOTE — Anesthesia Postprocedure Evaluation (Signed)
Anesthesia Post Note  Patient: Kevin FaceJoshua Shank Jr.  Procedure(s) Performed: LEFT CARPAL TUNNEL RELEASE (Left Wrist)     Patient location during evaluation: PACU Anesthesia Type: General Level of consciousness: awake and alert Pain management: pain level controlled Vital Signs Assessment: post-procedure vital signs reviewed and stable Respiratory status: spontaneous breathing, nonlabored ventilation and respiratory function stable Cardiovascular status: blood pressure returned to baseline and stable Postop Assessment: no apparent nausea or vomiting Anesthetic complications: no    Last Vitals:  Vitals:   08/08/17 0954 08/08/17 1009  BP:  117/83  Pulse:  73  Resp:  18  Temp:  36.6 C  SpO2: 95% 95%    Last Pain:  Vitals:   08/08/17 1009  TempSrc: Oral  PainSc: 0-No pain                 Cecile HearingStephen Edward Turk

## 2017-08-08 NOTE — Op Note (Signed)
   Carpal tunnel op note  DATE OF SURGERY:08/08/2017  PREOPERATIVE DIAGNOSIS:  Right Carpal tunnel syndrome  POSTOPERATIVE DIAGNOSIS: same  PROCEDURE:  Left carpal tunnel release. CPT [phone]  SURGEON: Surgeon(s): Tarry KosXu, Jacquetta Polhamus M, MD  ASSIST: Oneal GroutMary Lindsey Stanbery, PA-C; necessary for the timely completion of procedure and due to complexity of procedure.  ANESTHESIA:  General  TOURNIQUET TIME: less than 10 minutes  BLOOD LOSS: Minimal.  COMPLICATIONS: None.  PATHOLOGY: None.  INDICATIONS: The patient is a 52 y.o. -year-old male who presented with carpal tunnel syndrome failing nonsurgical management, indicated for surgical release.  DESCRIPTION OF PROCEDURE: The patient was identified in the preoperative holding area.  The operative site was marked by the surgeon and confirmed by the patient.  He was brought back to the operating room.  Anesthesia was induced by the anesthesia team.  A well padded nonsterile tourniquet was placed. The operative extremity was prepped and draped in standard sterile fashion.  A timeout was performed.  Preoperative antibiotics were given.   A palmar incision was made about 5 mm ulnar to the thenar crease.  The palmar aponeurosis was exposed and divided in line with the skin incision. The palmaris brevis was visualized and divided.  The distal edge of the transcarpal ligament was identified. A hemostat was inserted into the carpal tunnel to protect the median nerve and the flexor tendons. Then, the transverse carpal ligament was released under direct visualization. Proximally, a subcutaneous tunnel was made allowing a Sewell retractor to be placed. Then, the distal portion of the antebrachial fascia was released. Distally, all fibrous bands were released. The median nerve was visualized, and the fat pad was exposed. Following release, local infiltration with 0.25% of Sensorcaine was given. The tourniquet was deflated. Hemostasis achieved.  Wound was irrigated and  closed with 4-0 nylon sutures. Sterile dressing applied. The patient was transferred to the recovery room in stable condition after all counts were correct.  POSTOPERATIVE PLAN: To start nerve gliding exercises as tolerated and no heavy lifting for four weeks.
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
## 2017-08-08 NOTE — Discharge Instructions (Signed)
Postoperative instructions: ° °Weightbearing instructions: as tolerated. No heavy lifting for 4 weeks ° °Dressing instructions: Keep your dressing and/or splint clean and dry at all times.  It will be removed at your first post-operative appointment.  Your stitches and/or staples will be removed at this visit. ° °Incision instructions:  Do not soak your incision for 3 weeks after surgery.  If the incision gets wet, pat dry and do not scrub the incision. ° °Pain control:  You have been given a prescription to be taken as directed for post-operative pain control.  In addition, elevate the operative extremity above the heart at all times to prevent swelling and throbbing pain. ° °Take over-the-counter Colace, 100mg by mouth twice a day while taking narcotic pain medications to help prevent constipation. ° °Follow up appointments: °1) 10-14 days for suture removal and wound check. °2) Dr. Xu as scheduled. ° ° ------------------------------------------------------------------------------------------------------------- ° °After Surgery Pain Control: ° °After your surgery, post-surgical discomfort or pain is likely. This discomfort can last several days to a few weeks. At certain times of the day your discomfort may be more intense.  °Did you receive a nerve block?  °A nerve block can provide pain relief for one hour to two days after your surgery. As long as the nerve block is working, you will experience little or no sensation in the area the surgeon operated on.  °As the nerve block wears off, you will begin to experience pain or discomfort. It is very important that you begin taking your prescribed pain medication before the nerve block fully wears off. Treating your pain at the first sign of the block wearing off will ensure your pain is better controlled and more tolerable when full-sensation returns. Do not wait until the pain is intolerable, as the medicine will be less effective. It is better to treat pain in  advance than to try and catch up.  °General Anesthesia:  °If you did not receive a nerve block during your surgery, you will need to start taking your pain medication shortly after your surgery and should continue to do so as prescribed by your surgeon.  °Pain Medication:  °Most commonly we prescribe Vicodin and Percocet for post-operative pain. Both of these medications contain a combination of acetaminophen (Tylenol®) and a narcotic to help control pain.  °· It takes between 30 and 45 minutes before pain medication starts to work. It is important to take your medication before your pain level gets too intense.  °· Nausea is a common side effect of many pain medications. You will want to eat something before taking your pain medicine to help prevent nausea.  °· If you are taking a prescription pain medication that contains acetaminophen, we recommend that you do not take additional over the counter acetaminophen (Tylenol®).  °Other pain relieving options:  °· Using a cold pack to ice the affected area a few times a day (15 to 20 minutes at a time) can help to relieve pain, reduce swelling and bruising.  °· Elevation of the affected area can also help to reduce pain and swelling. ° ° ° ° °Post Anesthesia Home Care Instructions ° °Activity: °Get plenty of rest for the remainder of the day. A responsible individual must stay with you for 24 hours following the procedure.  °For the next 24 hours, DO NOT: °-Drive a car °-Operate machinery °-Drink alcoholic beverages °-Take any medication unless instructed by your physician °-Make any legal decisions or sign important papers. ° °Meals: °Start with   liquid foods such as gelatin or soup. Progress to regular foods as tolerated. Avoid greasy, spicy, heavy foods. If nausea and/or vomiting occur, drink only clear liquids until the nausea and/or vomiting subsides. Call your physician if vomiting continues. ° °Special Instructions/Symptoms: °Your throat may feel dry or sore from  the anesthesia or the breathing tube placed in your throat during surgery. If this causes discomfort, gargle with warm salt water. The discomfort should disappear within 24 hours. ° °If you had a scopolamine patch placed behind your ear for the management of post- operative nausea and/or vomiting: ° °1. The medication in the patch is effective for 72 hours, after which it should be removed.  Wrap patch in a tissue and discard in the trash. Wash hands thoroughly with soap and water. °2. You may remove the patch earlier than 72 hours if you experience unpleasant side effects which may include dry mouth, dizziness or visual disturbances. °3. Avoid touching the patch. Wash your hands with soap and water after contact with the patch. °  ° ° ° ° °

## 2017-08-08 NOTE — Transfer of Care (Signed)
Immediate Anesthesia Transfer of Care Note  Patient: Kevin FaceJoshua Moseman Jr.  Procedure(s) Performed: LEFT CARPAL TUNNEL RELEASE (Left Wrist)  Patient Location: PACU  Anesthesia Type:General  Level of Consciousness: sedated  Airway & Oxygen Therapy: Patient Spontanous Breathing and Patient connected to face mask oxygen  Post-op Assessment: Report given to RN and Post -op Vital signs reviewed and stable  Post vital signs: Reviewed and stable  Last Vitals:  Vitals Value Taken Time  BP 94/68 08/08/2017  9:10 AM  Temp    Pulse 73 08/08/2017  9:12 AM  Resp 25 08/08/2017  9:12 AM  SpO2 94 % 08/08/2017  9:12 AM  Vitals shown include unvalidated device data.  Last Pain:  Vitals:   08/08/17 0711  TempSrc: Oral  PainSc: 0-No pain         Complications: No apparent anesthesia complications

## 2017-08-09 ENCOUNTER — Encounter (HOSPITAL_BASED_OUTPATIENT_CLINIC_OR_DEPARTMENT_OTHER): Payer: Self-pay | Admitting: Orthopaedic Surgery

## 2017-08-22 ENCOUNTER — Ambulatory Visit (INDEPENDENT_AMBULATORY_CARE_PROVIDER_SITE_OTHER): Payer: Medicare Other | Admitting: Physician Assistant

## 2017-08-22 ENCOUNTER — Encounter (INDEPENDENT_AMBULATORY_CARE_PROVIDER_SITE_OTHER): Payer: Self-pay | Admitting: Orthopaedic Surgery

## 2017-08-22 DIAGNOSIS — Z9889 Other specified postprocedural states: Secondary | ICD-10-CM | POA: Insufficient documentation
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
## 2017-09-25 ENCOUNTER — Ambulatory Visit (INDEPENDENT_AMBULATORY_CARE_PROVIDER_SITE_OTHER): Payer: Medicare Other | Admitting: Orthopaedic Surgery

## 2017-09-25 ENCOUNTER — Encounter (INDEPENDENT_AMBULATORY_CARE_PROVIDER_SITE_OTHER): Payer: Self-pay | Admitting: Orthopaedic Surgery

## 2017-09-25 DIAGNOSIS — Z9889 Other specified postprocedural states: Secondary | ICD-10-CM | POA: Diagnosis not present

## 2017-09-25 DIAGNOSIS — M1711 Unilateral primary osteoarthritis, right knee: Secondary | ICD-10-CM | POA: Diagnosis not present

## 2017-09-25 MED ORDER — BUPIVACAINE HCL 0.5 % IJ SOLN
2.0000 mL | INTRAMUSCULAR | Status: AC | PRN
Start: 2017-09-25 — End: 2017-09-25
  Administered 2017-09-25: 2 mL via INTRA_ARTICULAR

## 2017-09-25 MED ORDER — LIDOCAINE HCL 1 % IJ SOLN
2.0000 mL | INTRAMUSCULAR | Status: AC | PRN
  Administered 2017-09-25: 2 mL

## 2017-09-25 MED ORDER — METHYLPREDNISOLONE ACETATE 40 MG/ML IJ SUSP
40.0000 mg | INTRAMUSCULAR | Status: AC | PRN
  Administered 2017-09-25: 40 mg via INTRA_ARTICULAR
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
## 2017-11-09 ENCOUNTER — Ambulatory Visit (INDEPENDENT_AMBULATORY_CARE_PROVIDER_SITE_OTHER): Payer: Medicare Other | Admitting: Orthopaedic Surgery

## 2017-11-09 ENCOUNTER — Ambulatory Visit (INDEPENDENT_AMBULATORY_CARE_PROVIDER_SITE_OTHER): Payer: Self-pay

## 2017-11-09 ENCOUNTER — Encounter (INDEPENDENT_AMBULATORY_CARE_PROVIDER_SITE_OTHER): Payer: Self-pay | Admitting: Orthopaedic Surgery

## 2017-11-09 DIAGNOSIS — M25561 Pain in right knee: Secondary | ICD-10-CM | POA: Diagnosis not present
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
## 2017-11-19 ENCOUNTER — Other Ambulatory Visit (INDEPENDENT_AMBULATORY_CARE_PROVIDER_SITE_OTHER): Payer: Self-pay | Admitting: Orthopaedic Surgery

## 2017-11-22 NOTE — Telephone Encounter (Signed)
Ok to refill 

## 2017-12-04 ENCOUNTER — Other Ambulatory Visit (INDEPENDENT_AMBULATORY_CARE_PROVIDER_SITE_OTHER): Payer: Self-pay | Admitting: Orthopaedic Surgery

## 2017-12-04 NOTE — Telephone Encounter (Signed)
yes

## 2018-01-07 ENCOUNTER — Telehealth (INDEPENDENT_AMBULATORY_CARE_PROVIDER_SITE_OTHER): Payer: Self-pay

## 2018-01-07 NOTE — Telephone Encounter (Signed)
[facility] Monovisc injection.  Authorization Reference# [id number] Valid 01/23/2018- 01/23/2019

## 2018-07-02 ENCOUNTER — Other Ambulatory Visit: Payer: Self-pay | Admitting: *Deleted

## 2018-07-02 DIAGNOSIS — Z20822 Contact with and (suspected) exposure to covid-19: Secondary | ICD-10-CM

## 2018-07-04 LAB — NOVEL CORONAVIRUS, NAA: SARS-CoV-2, NAA: NOT DETECTED

## 2019-03-28 ENCOUNTER — Emergency Department: Payer: Medicare Other

## 2019-03-28 ENCOUNTER — Encounter: Payer: Self-pay | Admitting: Emergency Medicine

## 2019-03-28 ENCOUNTER — Other Ambulatory Visit: Payer: Self-pay

## 2019-03-28 ENCOUNTER — Emergency Department
Admission: EM | Admit: 2019-03-28 | Discharge: 2019-03-28 | Disposition: A | Discharge status: Home / Self Care | Payer: Medicare Other | Attending: Emergency Medicine | Admitting: Emergency Medicine

## 2019-03-28 DIAGNOSIS — Z5321 Procedure and treatment not carried out due to patient leaving prior to being seen by health care provider: Secondary | ICD-10-CM | POA: Diagnosis not present

## 2019-03-28 DIAGNOSIS — R141 Gas pain: Secondary | ICD-10-CM | POA: Insufficient documentation

## 2019-03-28 DIAGNOSIS — R0789 Other chest pain: Secondary | ICD-10-CM | POA: Insufficient documentation

## 2019-03-28 LAB — BASIC METABOLIC PANEL
Anion gap: 11 (ref 5–15)
BUN: 12 mg/dL (ref 6–20)
CO2: 19 mmol/L — ABNORMAL LOW (ref 22–32)
Calcium: 9.5 mg/dL (ref 8.9–10.3)
Chloride: 101 mmol/L (ref 98–111)
Creatinine, Ser: 1.01 mg/dL (ref 0.61–1.24)
GFR calc Af Amer: >60 mL/min (ref >60)
GFR calc non Af Amer: >60 mL/min (ref >60)
Glucose, Bld: 108 mg/dL — ABNORMAL HIGH (ref 70–99)
Potassium: 3.7 mmol/L (ref 3.5–5.1)
Sodium: 131 mmol/L — ABNORMAL LOW (ref 135–145)

## 2019-03-28 LAB — CBC
HCT: 41.5 % (ref 39.0–52.0)
Hemoglobin: 14.1 g/dL (ref 13.0–17.0)
MCH: 29.2 pg (ref 26.0–34.0)
MCHC: 34 g/dL (ref 30.0–36.0)
MCV: 85.9 fL (ref 80.0–100.0)
Platelets: 245 10*3/uL (ref 150–400)
RBC: 4.83 MIL/uL (ref 4.22–5.81)
RDW: 17 % — ABNORMAL HIGH (ref 11.5–15.5)
WBC: 9.7 10*3/uL (ref 4.0–10.5)
nRBC: 0 % (ref 0.0–0.2)

## 2019-03-28 LAB — TROPONIN I (HIGH SENSITIVITY): Troponin I (High Sensitivity): 3 ng/L (ref <18)

## 2019-03-28 MED ORDER — SODIUM CHLORIDE 0.9% FLUSH: 3.0000 mL | Freq: Once | INTRAVENOUS | Status: DC

## 2019-03-28 NOTE — ED Triage Notes (Signed)
Pt to ER with c/o "gas pain", mid sternal chest pain without radiation that has been present most of the day.  Pt states pain got worse just prior to arrival.  Pt denies n/v or SHOB.

## 2019-03-28 NOTE — ED Notes (Signed)
Pt upset about wait time. Pt informed by security and this RN that he would get the next bed barring emergencies. Pt seen leaving lobby shortly after conversation.

## 2019-03-29 ENCOUNTER — Encounter: Payer: Self-pay | Admitting: *Deleted

## 2019-03-29 ENCOUNTER — Other Ambulatory Visit: Payer: Self-pay

## 2019-03-29 ENCOUNTER — Emergency Department
Admission: EM | Admit: 2019-03-29 | Discharge: 2019-03-29 | Disposition: A | Discharge status: Home / Self Care | Payer: Medicare Other | Attending: Emergency Medicine | Admitting: Emergency Medicine

## 2019-03-29 DIAGNOSIS — Z7982 Long term (current) use of aspirin: Secondary | ICD-10-CM | POA: Insufficient documentation

## 2019-03-29 DIAGNOSIS — R079 Chest pain, unspecified: Secondary | ICD-10-CM

## 2019-03-29 DIAGNOSIS — I10 Essential (primary) hypertension: Secondary | ICD-10-CM | POA: Diagnosis not present

## 2019-03-29 DIAGNOSIS — E119 Type 2 diabetes mellitus without complications: Secondary | ICD-10-CM | POA: Diagnosis not present

## 2019-03-29 DIAGNOSIS — Z96643 Presence of artificial hip joint, bilateral: Secondary | ICD-10-CM | POA: Diagnosis not present

## 2019-03-29 DIAGNOSIS — Z79899 Other long term (current) drug therapy: Secondary | ICD-10-CM | POA: Diagnosis not present

## 2019-03-29 DIAGNOSIS — K219 Gastro-esophageal reflux disease without esophagitis: Secondary | ICD-10-CM | POA: Diagnosis not present

## 2019-03-29 LAB — CBC WITH DIFFERENTIAL/PLATELET
Abs Immature Granulocytes: 0.02 10*3/uL (ref 0.00–0.07)
Basophils Absolute: 0 10*3/uL (ref 0.0–0.1)
Basophils Relative: 0 %
Eosinophils Absolute: 0.1 10*3/uL (ref 0.0–0.5)
Eosinophils Relative: 1 %
HCT: 41.6 % (ref 39.0–52.0)
Hemoglobin: 14.3 g/dL (ref 13.0–17.0)
Immature Granulocytes: 0 %
Lymphocytes Relative: 25 %
Lymphs Abs: 2.4 10*3/uL (ref 0.7–4.0)
MCH: 29.5 pg (ref 26.0–34.0)
MCHC: 34.4 g/dL (ref 30.0–36.0)
MCV: 85.8 fL (ref 80.0–100.0)
Monocytes Absolute: 1.1 10*3/uL — ABNORMAL HIGH (ref 0.1–1.0)
Monocytes Relative: 11 %
Neutro Abs: 6 10*3/uL (ref 1.7–7.7)
Neutrophils Relative %: 63 %
Platelets: 248 10*3/uL (ref 150–400)
RBC: 4.85 MIL/uL (ref 4.22–5.81)
RDW: 16.3 % — ABNORMAL HIGH (ref 11.5–15.5)
WBC: 9.5 10*3/uL (ref 4.0–10.5)
nRBC: 0 % (ref 0.0–0.2)

## 2019-03-29 LAB — COMPREHENSIVE METABOLIC PANEL
ALT: 29 U/L (ref 0–44)
AST: 27 U/L (ref 15–41)
Albumin: 4.6 g/dL (ref 3.5–5.0)
Alkaline Phosphatase: 58 U/L (ref 38–126)
Anion gap: 14 (ref 5–15)
BUN: 8 mg/dL (ref 6–20)
CO2: 21 mmol/L — ABNORMAL LOW (ref 22–32)
Calcium: 9.3 mg/dL (ref 8.9–10.3)
Chloride: 99 mmol/L (ref 98–111)
Creatinine, Ser: 1.02 mg/dL (ref 0.61–1.24)
GFR calc Af Amer: >60 mL/min (ref >60)
GFR calc non Af Amer: >60 mL/min (ref >60)
Glucose, Bld: 92 mg/dL (ref 70–99)
Potassium: 3.5 mmol/L (ref 3.5–5.1)
Sodium: 134 mmol/L — ABNORMAL LOW (ref 135–145)
Total Bilirubin: 1.6 mg/dL — ABNORMAL HIGH (ref 0.3–1.2)
Total Protein: 8.3 g/dL — ABNORMAL HIGH (ref 6.5–8.1)

## 2019-03-29 LAB — TROPONIN I (HIGH SENSITIVITY): Troponin I (High Sensitivity): 4 ng/L (ref <18)

## 2019-03-29 MED ORDER — ALUM & MAG HYDROXIDE-SIMETH 200-200-20 MG/5ML PO SUSP
30.0000 mL | Freq: Once | ORAL | Status: AC
  Administered 2019-03-29: 30 mL via ORAL
  Filled 2019-03-29: qty 30

## 2019-03-29 MED ORDER — OMEPRAZOLE 40 MG PO CPDR: 40.0000 mg | DELAYED_RELEASE_CAPSULE | Freq: Every day | ORAL | 0 refills | Status: AC

## 2019-03-29 MED ORDER — LIDOCAINE VISCOUS HCL 2 % MT SOLN
15.0000 mL | Freq: Once | OROMUCOSAL | Status: AC
  Administered 2019-03-29: 15 mL via ORAL
  Filled 2019-03-29: qty 15

## 2019-03-29 NOTE — ED Notes (Signed)
Ok to d/c 2nd trop per Loralyn Freshwater, FNP
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
## 2019-03-29 NOTE — Discharge Instructions (Signed)
Please follow up with primary care as scheduled or sooner if appointment is available.  Return to the ER for symptoms that change or worsen or for new complaints.

## 2019-03-29 NOTE — ED Notes (Signed)
Pt ambulated to restroom without difficulty or distress.

## 2019-03-29 NOTE — ED Triage Notes (Signed)
Patient ambulated to and from hallway bathroom with a slow, steady gait. Patient came here yesterday and was triaged, had EKG, Chest x-ray and blood work. Patient left because the wait was long and "they took someone who was bleeding before me." Patient c/o mid-sternal chest pain that began yesterday. Patient states pain was present in mid-back and went to chest after taking a Gas-x. Patient states pain was relieved with drinking a Coke. Patient state pain began again today after moving. Patient appears uncomfortable. No radiation of chest pain, denies nausea/shortness of breath. Patient state he ate two oranges for breakfast.

## 2019-03-29 NOTE — ED Notes (Signed)
First Nurse Note: Pt called nurse over asking "can you tell me anything". Pt informed we cannot give results that he will need to see the doctor. Pt states that he know we can't give him his results. Pt states that he waited 5.5 hours last night and was told the same thing. Pt states "this shit is crazy".

## 2019-03-29 NOTE — ED Notes (Signed)
Pt to ED via POV c/o mid sternal chest pain. Pt states that he was here last night but not seen. Pt left due to wait time. Pt is in NAD.

## 2019-03-29 NOTE — ED Notes (Signed)
Pt sleeping in lobby, in NAD. Respirations are equal and unlabored.

## 2019-06-10 ENCOUNTER — Telehealth: Payer: Self-pay | Admitting: Orthopaedic Surgery

## 2019-06-10 NOTE — Telephone Encounter (Signed)
Pt would like a call back from Dr. Roda Shutters because he has some questions for him and that's all he stated.   [phone]

## 2019-06-12 NOTE — Telephone Encounter (Signed)
Left voicemail for patient

## 2019-06-16 ENCOUNTER — Emergency Department: Payer: Medicare Other

## 2019-06-16 ENCOUNTER — Other Ambulatory Visit: Payer: Self-pay

## 2019-06-16 ENCOUNTER — Emergency Department
Admission: EM | Admit: 2019-06-16 | Discharge: 2019-06-16 | Disposition: A | Discharge status: Home / Self Care | Payer: Medicare Other | Attending: Emergency Medicine | Admitting: Emergency Medicine

## 2019-06-16 DIAGNOSIS — R519 Headache, unspecified: Secondary | ICD-10-CM | POA: Insufficient documentation

## 2019-06-16 DIAGNOSIS — M542 Cervicalgia: Secondary | ICD-10-CM | POA: Insufficient documentation

## 2019-06-16 DIAGNOSIS — Z79899 Other long term (current) drug therapy: Secondary | ICD-10-CM | POA: Insufficient documentation

## 2019-06-16 DIAGNOSIS — E119 Type 2 diabetes mellitus without complications: Secondary | ICD-10-CM | POA: Insufficient documentation

## 2019-06-16 DIAGNOSIS — Z7982 Long term (current) use of aspirin: Secondary | ICD-10-CM | POA: Insufficient documentation

## 2019-06-16 DIAGNOSIS — Z96643 Presence of artificial hip joint, bilateral: Secondary | ICD-10-CM | POA: Diagnosis not present

## 2019-06-16 DIAGNOSIS — I10 Essential (primary) hypertension: Secondary | ICD-10-CM | POA: Insufficient documentation

## 2019-06-16 MED ORDER — BACLOFEN 5 MG PO TABS
5.0000 mg | ORAL_TABLET | Freq: Three times a day (TID) | ORAL | 0 refills | Status: DC | PRN
Start: 2019-06-16 — End: 2020-05-18
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
## 2019-08-28 ENCOUNTER — Ambulatory Visit
Admission: EM | Admit: 2019-08-28 | Discharge: 2019-08-28 | Disposition: A | Discharge status: Home / Self Care | Payer: Medicare Other | Attending: Emergency Medicine | Admitting: Emergency Medicine

## 2019-08-28 DIAGNOSIS — J0110 Acute frontal sinusitis, unspecified: Secondary | ICD-10-CM | POA: Diagnosis not present

## 2019-08-28 LAB — POC SARS CORONAVIRUS 2 AG -  ED: SARS Coronavirus 2 Ag: NEGATIVE

## 2019-08-28 MED ORDER — AMOXICILLIN 875 MG PO TABS: 875.0000 mg | ORAL_TABLET | Freq: Two times a day (BID) | ORAL | 0 refills | Status: AC

## 2019-08-28 NOTE — Discharge Instructions (Signed)
Take the antibiotic as directed.    Your rapid COVID test is negative; the send-out test is pending.  You should self quarantine until your test result is back and is negative.    Follow up with your primary care provider if your symptoms are not improving.
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
## 2019-08-28 NOTE — ED Triage Notes (Signed)
Patient reports sinus pressure, nasal congestion and drainage, and cough x1 week. Denies fever, ShOB, chest pain, sore throat, n/v/d. Has been taking OTC mucinex.

## 2019-08-30 LAB — NOVEL CORONAVIRUS, NAA: SARS-CoV-2, NAA: NOT DETECTED

## 2019-08-30 LAB — SARS-COV-2, NAA 2 DAY TAT

## 2019-11-04 ENCOUNTER — Ambulatory Visit (INDEPENDENT_AMBULATORY_CARE_PROVIDER_SITE_OTHER): Payer: Medicare Other | Admitting: Orthopaedic Surgery

## 2019-11-04 ENCOUNTER — Ambulatory Visit: Payer: Self-pay

## 2019-11-04 ENCOUNTER — Telehealth: Payer: Self-pay

## 2019-11-04 DIAGNOSIS — M1711 Unilateral primary osteoarthritis, right knee: Secondary | ICD-10-CM

## 2019-11-04 DIAGNOSIS — M1712 Unilateral primary osteoarthritis, left knee: Secondary | ICD-10-CM | POA: Diagnosis not present

## 2019-11-04 DIAGNOSIS — M25511 Pain in right shoulder: Secondary | ICD-10-CM

## 2019-11-04 DIAGNOSIS — G8929 Other chronic pain: Secondary | ICD-10-CM | POA: Diagnosis not present

## 2019-11-04 MED ORDER — METHYLPREDNISOLONE ACETATE 40 MG/ML IJ SUSP
40.0000 mg | INTRAMUSCULAR | Status: AC | PRN
  Administered 2019-11-04: 40 mg via INTRA_ARTICULAR

## 2019-11-04 MED ORDER — BUPIVACAINE HCL 0.5 % IJ SOLN
3.0000 mL | INTRAMUSCULAR | Status: AC | PRN
  Administered 2019-11-04: 3 mL via INTRA_ARTICULAR

## 2019-11-04 MED ORDER — LIDOCAINE HCL 1 % IJ SOLN
3.0000 mL | INTRAMUSCULAR | Status: AC | PRN
  Administered 2019-11-04: 3 mL

## 2019-11-04 NOTE — Telephone Encounter (Signed)
Noted  
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
## 2019-11-04 NOTE — Telephone Encounter (Signed)
Please submit for Gel inj- bil knee.

## 2019-11-17 ENCOUNTER — Telehealth: Payer: Self-pay

## 2019-11-17 NOTE — Telephone Encounter (Signed)
Submitted VOB, SynviscOne, bilateral knee. 

## 2019-12-09 ENCOUNTER — Telehealth: Payer: Self-pay

## 2019-12-09 NOTE — Telephone Encounter (Signed)
Patient called he wants to know the status of pre approval for the gel injection. Call back:[phone]

## 2019-12-09 NOTE — Telephone Encounter (Signed)
Noted  

## 2019-12-11 ENCOUNTER — Telehealth: Payer: Self-pay

## 2019-12-11 NOTE — Telephone Encounter (Signed)
Called and left VM for patient to call office back for appointment.

## 2019-12-11 NOTE — Telephone Encounter (Signed)
Called and left a VM advising patient to call back to schedule appointment with Dr. Roda Shutters for gel injection.  Approved, SynviscOne, bilateral knee. Buy & Bill Patient will be responsible for 20% OOP. No Co-pay No PA required

## 2019-12-25 ENCOUNTER — Ambulatory Visit (INDEPENDENT_AMBULATORY_CARE_PROVIDER_SITE_OTHER): Payer: Medicare Other | Admitting: Orthopaedic Surgery

## 2019-12-25 ENCOUNTER — Other Ambulatory Visit: Payer: Self-pay

## 2019-12-25 DIAGNOSIS — G8929 Other chronic pain: Secondary | ICD-10-CM

## 2019-12-25 DIAGNOSIS — M170 Bilateral primary osteoarthritis of knee: Secondary | ICD-10-CM | POA: Diagnosis not present

## 2019-12-25 DIAGNOSIS — M25511 Pain in right shoulder: Secondary | ICD-10-CM

## 2019-12-25 MED ORDER — HYLAN G-F 20 48 MG/6ML IX SOSY
48.0000 mg | PREFILLED_SYRINGE | INTRA_ARTICULAR | Status: AC | PRN
  Administered 2019-12-25: 48 mg via INTRA_ARTICULAR

## 2019-12-25 MED ORDER — BUPIVACAINE HCL 0.25 % IJ SOLN
0.6600 mL | INTRAMUSCULAR | Status: AC | PRN
  Administered 2019-12-25: .66 mL via INTRA_ARTICULAR

## 2019-12-25 MED ORDER — LIDOCAINE HCL 1 % IJ SOLN
3.0000 mL | INTRAMUSCULAR | Status: AC | PRN
  Administered 2019-12-25: 3 mL

## 2019-12-25 MED ORDER — LIDOCAINE HCL 1 % IJ SOLN
3.0000 mL | INTRAMUSCULAR | Status: AC | PRN
Start: 2019-12-25 — End: 2019-12-25
  Administered 2019-12-25: 3 mL

## 2019-12-25 NOTE — Progress Notes (Signed)
   Procedure Note  Patient: Kevin Jennings.             Date of Birth: 1965-04-17           MRN: [id number]             Visit Date: 12/25/2019  Procedures: Visit Diagnoses:  1. Primary localized osteoarthritis of knees, bilateral     Large Joint Inj: bilateral knee on 12/25/2019 8:51 AM Indications: pain Details: 22 G needle, anterolateral approach Medications (Right): 0.66 mL bupivacaine 0.25 %; 3 mL lidocaine 1 %; 48 mg Hylan 48 MG/6ML Medications (Left): 0.66 mL bupivacaine 0.25 %; 3 mL lidocaine 1 %; 48 mg Hylan 48 MG/6ML

## 2020-01-14 ENCOUNTER — Other Ambulatory Visit: Payer: Self-pay

## 2020-01-14 ENCOUNTER — Ambulatory Visit
Admission: RE | Admit: 2020-01-14 | Discharge: 2020-01-14 | Disposition: A | Discharge status: Home / Self Care | Payer: Medicare Other | Source: Ambulatory Visit | Attending: Orthopaedic Surgery | Admitting: Orthopaedic Surgery

## 2020-01-14 DIAGNOSIS — G8929 Other chronic pain: Secondary | ICD-10-CM

## 2020-01-15 ENCOUNTER — Ambulatory Visit: Payer: Medicare Other | Admitting: Orthopaedic Surgery

## 2020-01-20 ENCOUNTER — Other Ambulatory Visit: Payer: Self-pay

## 2020-01-20 ENCOUNTER — Ambulatory Visit (INDEPENDENT_AMBULATORY_CARE_PROVIDER_SITE_OTHER): Payer: Medicare Other | Admitting: Orthopaedic Surgery

## 2020-01-20 ENCOUNTER — Encounter: Payer: Self-pay | Admitting: Orthopaedic Surgery

## 2020-01-20 DIAGNOSIS — M19011 Primary osteoarthritis, right shoulder: Secondary | ICD-10-CM | POA: Diagnosis not present

## 2020-01-20 DIAGNOSIS — M75111 Incomplete rotator cuff tear or rupture of right shoulder, not specified as traumatic: Secondary | ICD-10-CM | POA: Diagnosis not present

## 2020-01-20 DIAGNOSIS — M7541 Impingement syndrome of right shoulder: Secondary | ICD-10-CM | POA: Insufficient documentation
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
## 2020-02-03 ENCOUNTER — Other Ambulatory Visit: Payer: Self-pay | Admitting: Physician Assistant

## 2020-02-03 MED ORDER — ONDANSETRON HCL 4 MG PO TABS: 4.0000 mg | ORAL_TABLET | Freq: Three times a day (TID) | ORAL | 0 refills | Status: DC | PRN

## 2020-02-03 MED ORDER — OXYCODONE-ACETAMINOPHEN 5-325 MG PO TABS: 1.0000 | ORAL_TABLET | Freq: Three times a day (TID) | ORAL | 0 refills | Status: DC | PRN

## 2020-02-17 ENCOUNTER — Ambulatory Visit: Payer: Medicare Other | Admitting: Orthopaedic Surgery

## 2020-02-19 ENCOUNTER — Encounter: Payer: Self-pay | Admitting: Orthopaedic Surgery

## 2020-02-19 ENCOUNTER — Other Ambulatory Visit: Payer: Self-pay | Admitting: Physician Assistant

## 2020-02-19 ENCOUNTER — Telehealth: Payer: Self-pay | Admitting: Orthopaedic Surgery

## 2020-02-19 DIAGNOSIS — M24111 Other articular cartilage disorders, right shoulder: Secondary | ICD-10-CM | POA: Diagnosis not present

## 2020-02-19 DIAGNOSIS — M75111 Incomplete rotator cuff tear or rupture of right shoulder, not specified as traumatic: Secondary | ICD-10-CM

## 2020-02-19 DIAGNOSIS — S43431A Superior glenoid labrum lesion of right shoulder, initial encounter: Secondary | ICD-10-CM | POA: Diagnosis not present

## 2020-02-19 DIAGNOSIS — M19011 Primary osteoarthritis, right shoulder: Secondary | ICD-10-CM | POA: Diagnosis not present

## 2020-02-19 DIAGNOSIS — M7521 Bicipital tendinitis, right shoulder: Secondary | ICD-10-CM | POA: Diagnosis not present

## 2020-02-19 MED ORDER — ONDANSETRON HCL 4 MG PO TABS: 4.0000 mg | ORAL_TABLET | Freq: Three times a day (TID) | ORAL | 0 refills | Status: DC | PRN

## 2020-02-19 MED ORDER — OXYCODONE-ACETAMINOPHEN 5-325 MG PO TABS: 1.0000 | ORAL_TABLET | Freq: Three times a day (TID) | ORAL | 0 refills | Status: DC | PRN

## 2020-02-19 NOTE — Telephone Encounter (Signed)
Pt called stating he dropped his percocet's in the sink and has surgery today, so he'll need to have them called in again.   [phone]

## 2020-02-19 NOTE — Telephone Encounter (Signed)
Mardella Layman has called in Rx to be taken after SU.

## 2020-03-02 ENCOUNTER — Encounter: Payer: Self-pay | Admitting: Orthopaedic Surgery

## 2020-03-02 ENCOUNTER — Ambulatory Visit (INDEPENDENT_AMBULATORY_CARE_PROVIDER_SITE_OTHER): Payer: Medicare Other | Admitting: Physician Assistant

## 2020-03-02 ENCOUNTER — Other Ambulatory Visit: Payer: Self-pay

## 2020-03-02 DIAGNOSIS — Z9889 Other specified postprocedural states: Secondary | ICD-10-CM

## 2020-03-02 MED ORDER — OXYCODONE-ACETAMINOPHEN 5-325 MG PO TABS: 1.0000 | ORAL_TABLET | Freq: Three times a day (TID) | ORAL | 0 refills | Status: DC | PRN

## 2020-03-02 NOTE — Addendum Note (Signed)
Addended by: Cristie Hem on: 03/02/2020 09:05 AM   Modules accepted: Orders
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
## 2020-04-06 ENCOUNTER — Ambulatory Visit: Payer: Medicare Other | Admitting: Physician Assistant

## 2020-04-16 ENCOUNTER — Ambulatory Visit: Payer: Self-pay

## 2020-04-16 ENCOUNTER — Ambulatory Visit (INDEPENDENT_AMBULATORY_CARE_PROVIDER_SITE_OTHER): Payer: Medicare Other | Admitting: Orthopaedic Surgery

## 2020-04-16 DIAGNOSIS — Z9889 Other specified postprocedural states: Secondary | ICD-10-CM

## 2020-04-16 DIAGNOSIS — M1711 Unilateral primary osteoarthritis, right knee: Secondary | ICD-10-CM

## 2020-04-16 MED ORDER — OXYCODONE-ACETAMINOPHEN 5-325 MG PO TABS: 1.0000 | ORAL_TABLET | Freq: Every evening | ORAL | 0 refills | Status: DC | PRN

## 2020-04-16 NOTE — Addendum Note (Signed)
Addended by: Albertina Parr on: 04/16/2020 11:01 AM   Modules accepted: Orders
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
## 2020-04-17 LAB — EXTRA LAV TOP TUBE

## 2020-04-17 LAB — PREALBUMIN: Prealbumin: 35 mg/dL (ref 21–43)

## 2020-04-20 ENCOUNTER — Other Ambulatory Visit: Payer: Self-pay
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
## 2020-05-06 ENCOUNTER — Other Ambulatory Visit (HOSPITAL_COMMUNITY): Payer: Medicare Other

## 2020-05-12 ENCOUNTER — Other Ambulatory Visit: Payer: Self-pay | Admitting: Physician Assistant

## 2020-05-12 MED ORDER — OXYCODONE-ACETAMINOPHEN 5-325 MG PO TABS
1.0000 | ORAL_TABLET | Freq: Four times a day (QID) | ORAL | 0 refills | Status: DC | PRN
Start: 2020-05-12 — End: 2020-05-16

## 2020-05-12 MED ORDER — ONDANSETRON HCL 4 MG PO TABS: 4.0000 mg | ORAL_TABLET | Freq: Three times a day (TID) | ORAL | 0 refills | Status: DC | PRN

## 2020-05-12 MED ORDER — DOCUSATE SODIUM 100 MG PO CAPS: 100.0000 mg | ORAL_CAPSULE | Freq: Every day | ORAL | 2 refills | Status: AC | PRN

## 2020-05-12 MED ORDER — METHOCARBAMOL 500 MG PO TABS: 500.0000 mg | ORAL_TABLET | Freq: Two times a day (BID) | ORAL | 0 refills | Status: DC | PRN

## 2020-05-12 MED ORDER — ASPIRIN EC 81 MG PO TBEC: 81.0000 mg | DELAYED_RELEASE_TABLET | Freq: Two times a day (BID) | ORAL | 0 refills | Status: DC

## 2020-05-12 MED ORDER — SULFAMETHOXAZOLE-TRIMETHOPRIM 800-160 MG PO TABS: 1.0000 | ORAL_TABLET | Freq: Two times a day (BID) | ORAL | 0 refills | Status: DC

## 2020-05-12 NOTE — Progress Notes (Signed)
Surgical Instructions    Your procedure is scheduled on 05/17/20.  Report to [facility] Main Entrance "A" at 08:00 A.M., then check in with the Admitting office.  Call this number if you have problems the morning of surgery:  [phone]   If you have any questions prior to your surgery date call [phone]: Open Monday-Friday 8am-4pm    Remember:  Do not eat after midnight the night before your surgery  You may drink clear liquids until 07:00am the morning of your surgery.   Clear liquids allowed are: Water, Non-Citrus Juices (without pulp), Carbonated Beverages, Clear Tea, Black Coffee Only, and Gatorade  Patient Instructions  . The night before surgery:  o No food after midnight. ONLY clear liquids after midnight  . The day of surgery (if you do NOT have diabetes):  o Drink ONE (1) Pre-Surgery Clear Ensure by 07:00am the morning of surgery. Drink in one sitting. Do not sip.  o This drink was given to you during your hospital  pre-op appointment visit. o Nothing else to drink after completing the  Pre-Surgery Clear Ensure.          If you have questions, please contact your surgeon's office.     Take these medicines the morning of surgery with A SIP OF WATER  amLODipine (NORVASC)  atorvastatin (LIPITOR) cetirizine (ZYRTEC)  fluticasone (FLONASE)  omeprazole (PRILOSEC)   As of today, STOP taking any Aspirin (unless otherwise instructed by your surgeon) Aleve, Naproxen, Ibuprofen, Motrin, Advil, Goody's, BC's, all herbal medications, fish oil, and all vitamins.                     Do not wear jewelry, make up, or nail polish            Do not wear lotions, powders, perfumes/colognes, or deodorant.            Men may shave face and neck.            Do not bring valuables to the hospital.            [facility] is not responsible for any belongings or valuables.  Do NOT Smoke (Tobacco/Vaping) or drink Alcohol 24 hours prior to your procedure If you use a CPAP at  night, you may bring all equipment for your overnight stay.   Contacts, glasses, dentures or bridgework may not be worn into surgery, please bring cases for these belongings   For patients admitted to the hospital, discharge time will be determined by your treatment team.   Patients discharged the day of surgery will not be allowed to drive home, and someone needs to stay with them for 24 hours.    Special instructions:   [place]- Preparing For Surgery  Before surgery, you can play an important role. Because skin is not sterile, your skin needs to be as free of germs as possible. You can reduce the number of germs on your skin by washing with CHG (chlorahexidine gluconate) Soap before surgery.  CHG is an antiseptic cleaner which kills germs and bonds with the skin to continue killing germs even after washing.    Oral Hygiene is also important to reduce your risk of infection.  Remember - BRUSH YOUR TEETH THE MORNING OF SURGERY WITH YOUR REGULAR TOOTHPASTE  Please do not use if you have an allergy to CHG or antibacterial soaps. If your skin becomes reddened/irritated stop using the CHG.  Do not shave (including legs and underarms) for  at least 48 hours prior to first CHG shower. It is OK to shave your face.  Please follow these instructions carefully.   1. Shower the NIGHT BEFORE SURGERY and the MORNING OF SURGERY  2. If you chose to wash your hair, wash your hair first as usual with your normal shampoo.  3. After you shampoo, rinse your hair and body thoroughly to remove the shampoo.  4. Wash Face and genitals (private parts) with your normal soap.   5.  Shower the NIGHT BEFORE SURGERY and the MORNING OF SURGERY with CHG Soap.   6. Use CHG Soap as you would any other liquid soap. You can apply CHG directly to the skin and wash gently with a scrungie or a clean washcloth.   7. Apply the CHG Soap to your body ONLY FROM THE NECK DOWN.  Do not use on open wounds or open sores. Avoid  contact with your eyes, ears, mouth and genitals (private parts). Wash Face and genitals (private parts)  with your normal soap.   8. Wash thoroughly, paying special attention to the area where your surgery will be performed.  9. Thoroughly rinse your body with warm water from the neck down.  10. DO NOT shower/wash with your normal soap after using and rinsing off the CHG Soap.  11. Pat yourself dry with a CLEAN TOWEL.  12. Wear CLEAN PAJAMAS to bed the night before surgery  13. Place CLEAN SHEETS on your bed the night before your surgery  14. DO NOT SLEEP WITH PETS.   Day of Surgery: Take a shower with CHG soap. Wear Clean/Comfortable clothing the morning of surgery Do not apply any deodorants/lotions.   Remember to brush your teeth WITH YOUR REGULAR TOOTHPASTE.   Please read over the following fact sheets that you were given.

## 2020-05-13 ENCOUNTER — Other Ambulatory Visit (HOSPITAL_COMMUNITY): Payer: Medicare Other

## 2020-05-13 ENCOUNTER — Encounter (HOSPITAL_COMMUNITY)
Admission: RE | Admit: 2020-05-13 | Discharge: 2020-05-13 | Disposition: A | Discharge status: Home / Self Care | Payer: Medicare Other | Source: Ambulatory Visit | Attending: Orthopaedic Surgery | Admitting: Orthopaedic Surgery

## 2020-05-13 ENCOUNTER — Encounter (HOSPITAL_COMMUNITY): Payer: Self-pay

## 2020-05-13 ENCOUNTER — Ambulatory Visit (HOSPITAL_COMMUNITY)
Admission: RE | Admit: 2020-05-13 | Discharge: 2020-05-13 | Disposition: A | Discharge status: Home / Self Care | Payer: Medicare Other | Source: Ambulatory Visit | Attending: Physician Assistant | Admitting: Physician Assistant

## 2020-05-13 ENCOUNTER — Other Ambulatory Visit: Payer: Self-pay

## 2020-05-13 DIAGNOSIS — M1711 Unilateral primary osteoarthritis, right knee: Secondary | ICD-10-CM | POA: Diagnosis not present

## 2020-05-13 DIAGNOSIS — Z01818 Encounter for other preprocedural examination: Secondary | ICD-10-CM | POA: Diagnosis present

## 2020-05-13 DIAGNOSIS — Z20822 Contact with and (suspected) exposure to covid-19: Secondary | ICD-10-CM | POA: Diagnosis not present

## 2020-05-13 HISTORY — DX: Gastro-esophageal reflux disease without esophagitis: K21.9

## 2020-05-13 LAB — CBC WITH DIFFERENTIAL/PLATELET
Abs Immature Granulocytes: 0.04 10*3/uL (ref 0.00–0.07)
Basophils Absolute: 0.1 10*3/uL (ref 0.0–0.1)
Basophils Relative: 1 %
Eosinophils Absolute: 0.1 10*3/uL (ref 0.0–0.5)
Eosinophils Relative: 1 %
HCT: 42 % (ref 39.0–52.0)
Hemoglobin: 14 g/dL (ref 13.0–17.0)
Immature Granulocytes: 0 %
Lymphocytes Relative: 22 %
Lymphs Abs: 2.5 10*3/uL (ref 0.7–4.0)
MCH: 29.7 pg (ref 26.0–34.0)
MCHC: 33.3 g/dL (ref 30.0–36.0)
MCV: 89 fL (ref 80.0–100.0)
Monocytes Absolute: 1.1 10*3/uL — ABNORMAL HIGH (ref 0.1–1.0)
Monocytes Relative: 10 %
Neutro Abs: 7.2 10*3/uL (ref 1.7–7.7)
Neutrophils Relative %: 66 %
Platelets: 292 10*3/uL (ref 150–400)
RBC: 4.72 MIL/uL (ref 4.22–5.81)
RDW: 15.6 % — ABNORMAL HIGH (ref 11.5–15.5)
WBC: 10.9 10*3/uL — ABNORMAL HIGH (ref 4.0–10.5)
nRBC: 0 % (ref 0.0–0.2)

## 2020-05-13 LAB — URINALYSIS, ROUTINE W REFLEX MICROSCOPIC
Bacteria, UA: NONE SEEN
Bilirubin Urine: NEGATIVE
Glucose, UA: NEGATIVE mg/dL
Hgb urine dipstick: NEGATIVE
Ketones, ur: NEGATIVE mg/dL
Leukocytes,Ua: NEGATIVE
Nitrite: NEGATIVE
Protein, ur: NEGATIVE mg/dL
Specific Gravity, Urine: 1.016 (ref 1.005–1.030)
pH: 5 (ref 5.0–8.0)

## 2020-05-13 LAB — PROTIME-INR
INR: 1.1 (ref 0.8–1.2)
Prothrombin Time: 14 seconds (ref 11.4–15.2)

## 2020-05-13 LAB — COMPREHENSIVE METABOLIC PANEL
ALT: 30 U/L (ref 0–44)
AST: 29 U/L (ref 15–41)
Albumin: 4.3 g/dL (ref 3.5–5.0)
Alkaline Phosphatase: 62 U/L (ref 38–126)
Anion gap: 8 (ref 5–15)
BUN: 12 mg/dL (ref 6–20)
CO2: 25 mmol/L (ref 22–32)
Calcium: 9.5 mg/dL (ref 8.9–10.3)
Chloride: 104 mmol/L (ref 98–111)
Creatinine, Ser: 1.11 mg/dL (ref 0.61–1.24)
GFR, Estimated: >60 mL/min (ref >60)
Glucose, Bld: 109 mg/dL — ABNORMAL HIGH (ref 70–99)
Potassium: 3.8 mmol/L (ref 3.5–5.1)
Sodium: 137 mmol/L (ref 135–145)
Total Bilirubin: 1.9 mg/dL — ABNORMAL HIGH (ref 0.3–1.2)
Total Protein: 7.6 g/dL (ref 6.5–8.1)

## 2020-05-13 LAB — APTT: aPTT: 31 seconds (ref 24–36)

## 2020-05-13 LAB — SARS CORONAVIRUS 2 (TAT 6-24 HRS): SARS Coronavirus 2: NEGATIVE

## 2020-05-13 LAB — TYPE AND SCREEN
ABO/RH(D): O POS
Antibody Screen: NEGATIVE

## 2020-05-13 LAB — SURGICAL PCR SCREEN
MRSA, PCR: NEGATIVE
Staphylococcus aureus: NEGATIVE

## 2020-05-13 NOTE — Progress Notes (Addendum)
Surgical Instructions    Your procedure is scheduled on 05/17/20.  Report to [facility] Main Entrance "A" at 08:00 A.M., then check in with the Admitting office.  Call this number if you have problems the morning of surgery:  [phone]   If you have any questions prior to your surgery date call [phone]: Open Monday-Friday 8am-4pm    Remember:  Do not eat after midnight the night before your surgery  You may drink clear liquids until 07:00am the morning of your surgery.   Clear liquids allowed are: Water, Non-Citrus Juices (without pulp), Carbonated Beverages, Clear Tea, Black Coffee Only, and Gatorade  Patient Instructions  . The night before surgery:  o No food after midnight. ONLY clear liquids after midnight  . The day of surgery (if you do NOT have diabetes):  o Drink ONE (1) Pre-Surgery Clear Ensure by 07:00am the morning of surgery. Drink in one sitting. Do not sip.  o This drink was given to you during your hospital  pre-op appointment visit. o Nothing else to drink after completing the  Pre-Surgery Clear Ensure.          If you have questions, please contact your surgeon's office.     Take these medicines the morning of surgery with A SIP OF WATER  amLODipine (NORVASC)  atorvastatin (LIPITOR) cetirizine (ZYRTEC)  fluticasone (FLONASE)  omeprazole (PRILOSEC)   As of today, STOP taking any Aleve, Naproxen, Ibuprofen, Motrin, Advil, Goody's, BC's, all herbal medications, supplements, fish oil, and all vitamins.  Follow your surgeon's instructions on when to stop Aspirin.  If no instructions were given by your surgeon then you will need to call the office to get those instructions.                        Do not wear jewelry            Do not wear lotions, powders, colognes, or deodorant.            Men may shave face and neck.            Do not bring valuables to the hospital.            [facility] is not responsible for any belongings or valuables.  Do  NOT Smoke (Tobacco/Vaping) or drink Alcohol 24 hours prior to your procedure  If you use a CPAP at night, you may bring all equipment for your overnight stay.   Contacts, hearing aids, glasses, dentures or partials may not be worn into surgery, please bring cases for these belongings   For patients admitted to the hospital, discharge time will be determined by your treatment team.   Patients discharged the day of surgery will not be allowed to drive home, and someone needs to stay with them for 24 hours.    Special instructions:   [place]- Preparing For Surgery  Before surgery, you can play an important role. Because skin is not sterile, your skin needs to be as free of germs as possible. You can reduce the number of germs on your skin by washing with CHG (chlorahexidine gluconate) Soap before surgery.  CHG is an antiseptic cleaner which kills germs and bonds with the skin to continue killing germs even after washing.    Oral Hygiene is also important to reduce your risk of infection.  Remember - BRUSH YOUR TEETH THE MORNING OF SURGERY WITH YOUR REGULAR TOOTHPASTE  Please do not use if you  have an allergy to CHG or antibacterial soaps. If your skin becomes reddened/irritated stop using the CHG.  Do not shave (including legs and underarms) for at least 48 hours prior to first CHG shower. It is OK to shave your face.  Please follow these instructions carefully.   1. Shower the NIGHT BEFORE SURGERY and the MORNING OF SURGERY  2. If you chose to wash your hair, wash your hair first as usual with your normal shampoo.  3. After you shampoo, rinse your hair and body thoroughly to remove the shampoo.  4. Wash Face and genitals (private parts) with your normal soap.   5.  Shower the NIGHT BEFORE SURGERY and the MORNING OF SURGERY with CHG Soap.   6. Use CHG Soap as you would any other liquid soap. You can apply CHG directly to the skin and wash gently with a scrungie or a clean  washcloth.   7. Apply the CHG Soap to your body ONLY FROM THE NECK DOWN.  Do not use on open wounds or open sores. Avoid contact with your eyes, ears, mouth and genitals (private parts). Wash Face and genitals (private parts)  with your normal soap.   8. Wash thoroughly, paying special attention to the area where your surgery will be performed.  9. Thoroughly rinse your body with warm water from the neck down.  10. DO NOT shower/wash with your normal soap after using and rinsing off the CHG Soap.  11. Pat yourself dry with a CLEAN TOWEL.  12. Wear CLEAN PAJAMAS to bed the night before surgery  13. Place CLEAN SHEETS on your bed the night before your surgery  14. DO NOT SLEEP WITH PETS.   Day of Surgery: Take a shower with CHG soap. Wear Clean/Comfortable clothing the morning of surgery Do not apply any deodorants/lotions.   Remember to brush your teeth WITH YOUR REGULAR TOOTHPASTE.   Please read over the following fact sheets that you were given.

## 2020-05-13 NOTE — Progress Notes (Signed)
   05/13/20 0957  OBSTRUCTIVE SLEEP APNEA  Have you ever been diagnosed with sleep apnea through a sleep study? No  Do you snore loudly (loud enough to be heard through closed doors)?  0  Do you often feel tired, fatigued, or sleepy during the daytime (such as falling asleep during driving or talking to someone)? 1  Has anyone observed you stop breathing during your sleep? 0  Do you have, or are you being treated for high blood pressure? 1  BMI more than 35 kg/m2? 1  Age > 50 (1-yes) 1  Neck circumference greater than:Male 16 inches or larger, Male 17inches or larger? 1  Male Gender (Yes=1) 1  Obstructive Sleep Apnea Score 6  Score 5 or greater  Results sent to PCP

## 2020-05-13 NOTE — Progress Notes (Signed)
PCP - [facility], Fatima Sanger Cardiologist -   PPM/ICD -  n/a Device Orders -  Rep Notified -   Chest x-ray - 05/13/20 EKG - 05/13/20 Stress Test - patient denies ECHO - patient denies Cardiac Cath - patient denies  Sleep Study - patient denies, positive stop-bang CPAP -   Fasting Blood Sugar - n/a Checks Blood Sugar _____ times a day  Blood Thinner Instructions: n/a Aspirin Instructions: last dose 05/12/20  ERAS Protcol - clears before 0700 PRE-SURGERY Ensure or G2- Ensure given to be completed by 0700  COVID TEST- done at PAT appointment 05/13/20   Anesthesia review: n/a  Patient denies shortness of breath, fever, cough and chest pain at PAT appointment   All instructions explained to the patient, with a verbal understanding of the material. Patient agrees to go over the instructions while at home for a better understanding. Patient also instructed to self quarantine after being tested for COVID-19. The opportunity to ask questions was provided.

## 2020-05-14 ENCOUNTER — Telehealth: Payer: Self-pay | Admitting: Orthopaedic Surgery

## 2020-05-14 MED ORDER — CEFAZOLIN IN SODIUM CHLORIDE 3-0.9 GM/100ML-% IV SOLN
3.0000 g | INTRAVENOUS | Status: DC
  Filled 2020-05-14: qty 100

## 2020-05-14 MED ORDER — TRANEXAMIC ACID 1000 MG/10ML IV SOLN
2000.0000 mg | INTRAVENOUS | Status: AC
  Administered 2020-05-17: 2000 mg via TOPICAL
  Filled 2020-05-14: qty 20
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
## 2020-05-16 ENCOUNTER — Other Ambulatory Visit: Payer: Self-pay | Admitting: Physician Assistant

## 2020-05-16 MED ORDER — OXYCODONE-ACETAMINOPHEN 5-325 MG PO TABS: 1.0000 | ORAL_TABLET | Freq: Four times a day (QID) | ORAL | 0 refills | Status: DC | PRN

## 2020-05-16 MED ORDER — ASPIRIN EC 81 MG PO TBEC: 81.0000 mg | DELAYED_RELEASE_TABLET | Freq: Two times a day (BID) | ORAL | 0 refills | Status: AC

## 2020-05-16 MED ORDER — METHOCARBAMOL 500 MG PO TABS: 500.0000 mg | ORAL_TABLET | Freq: Two times a day (BID) | ORAL | 0 refills | Status: DC | PRN

## 2020-05-16 MED ORDER — ONDANSETRON HCL 4 MG PO TABS: 4.0000 mg | ORAL_TABLET | Freq: Three times a day (TID) | ORAL | 0 refills | Status: AC | PRN

## 2020-05-16 MED ORDER — SULFAMETHOXAZOLE-TRIMETHOPRIM 800-160 MG PO TABS: 1.0000 | ORAL_TABLET | Freq: Two times a day (BID) | ORAL | 0 refills | Status: AC

## 2020-05-16 NOTE — Telephone Encounter (Signed)
Sent in
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
## 2020-05-17 ENCOUNTER — Ambulatory Visit (HOSPITAL_COMMUNITY): Payer: Medicare Other | Admitting: Vascular Surgery

## 2020-05-17 ENCOUNTER — Encounter (HOSPITAL_COMMUNITY): Payer: Self-pay | Admitting: Orthopaedic Surgery

## 2020-05-17 ENCOUNTER — Observation Stay (HOSPITAL_COMMUNITY)
Admission: RE | Admit: 2020-05-17 | Discharge: 2020-05-18 | Disposition: A | Discharge status: Home / Self Care | Payer: Medicare Other | Source: Ambulatory Visit | Attending: Orthopaedic Surgery | Admitting: Orthopaedic Surgery

## 2020-05-17 ENCOUNTER — Ambulatory Visit (HOSPITAL_COMMUNITY): Payer: Medicare Other | Admitting: Anesthesiology

## 2020-05-17 ENCOUNTER — Observation Stay (HOSPITAL_COMMUNITY): Payer: Medicare Other

## 2020-05-17 ENCOUNTER — Other Ambulatory Visit: Payer: Self-pay

## 2020-05-17 ENCOUNTER — Encounter (HOSPITAL_COMMUNITY)
Admission: RE | Disposition: A | Discharge status: Home / Self Care | Payer: Self-pay | Source: Ambulatory Visit | Attending: Orthopaedic Surgery

## 2020-05-17 DIAGNOSIS — M1711 Unilateral primary osteoarthritis, right knee: Secondary | ICD-10-CM | POA: Diagnosis present

## 2020-05-17 DIAGNOSIS — Z7982 Long term (current) use of aspirin: Secondary | ICD-10-CM | POA: Insufficient documentation

## 2020-05-17 DIAGNOSIS — Z79899 Other long term (current) drug therapy: Secondary | ICD-10-CM | POA: Diagnosis not present

## 2020-05-17 DIAGNOSIS — Z96651 Presence of right artificial knee joint: Secondary | ICD-10-CM

## 2020-05-17 DIAGNOSIS — I10 Essential (primary) hypertension: Secondary | ICD-10-CM | POA: Diagnosis not present

## 2020-05-17 DIAGNOSIS — Z96643 Presence of artificial hip joint, bilateral: Secondary | ICD-10-CM | POA: Diagnosis not present

## 2020-05-17 DIAGNOSIS — R52 Pain, unspecified: Secondary | ICD-10-CM

## 2020-05-17 HISTORY — PX: TOTAL KNEE ARTHROPLASTY: SHX125

## 2020-05-17 SURGERY — ARTHROPLASTY, KNEE, TOTAL
Anesthesia: Spinal | Site: Knee | Laterality: Right

## 2020-05-17 MED ORDER — MAGNESIUM CITRATE PO SOLN: 1.0000 | Freq: Once | ORAL | Status: DC | PRN

## 2020-05-17 MED ORDER — FENTANYL CITRATE (PF) 100 MCG/2ML IJ SOLN
INTRAMUSCULAR | Status: AC
  Filled 2020-05-17: qty 2

## 2020-05-17 MED ORDER — PROPOFOL 500 MG/50ML IV EMUL
INTRAVENOUS | Status: DC | PRN
  Administered 2020-05-17: 100 ug/kg/min via INTRAVENOUS

## 2020-05-17 MED ORDER — TRANEXAMIC ACID-NACL 1000-0.7 MG/100ML-% IV SOLN
1000.0000 mg | INTRAVENOUS | Status: AC
  Administered 2020-05-17: 1000 mg via INTRAVENOUS
  Filled 2020-05-17: qty 100

## 2020-05-17 MED ORDER — EPHEDRINE 5 MG/ML INJ
INTRAVENOUS | Status: AC
  Filled 2020-05-17: qty 10

## 2020-05-17 MED ORDER — SODIUM CHLORIDE 0.9 % IR SOLN
Status: DC | PRN
  Administered 2020-05-17: 3000 mL

## 2020-05-17 MED ORDER — DEXTROSE 5 % IV SOLN
INTRAVENOUS | Status: DC | PRN
  Administered 2020-05-17: 3 g via INTRAVENOUS

## 2020-05-17 MED ORDER — OXYCODONE HCL 5 MG PO TABS
5.0000 mg | ORAL_TABLET | ORAL | Status: DC | PRN
  Administered 2020-05-17 – 2020-05-18 (×5): 10 mg via ORAL
  Filled 2020-05-17 (×5): qty 2

## 2020-05-17 MED ORDER — ASPIRIN 81 MG PO CHEW
81.0000 mg | CHEWABLE_TABLET | Freq: Two times a day (BID) | ORAL | Status: DC
  Administered 2020-05-17 – 2020-05-18 (×2): 81 mg via ORAL
  Filled 2020-05-17 (×2): qty 1

## 2020-05-17 MED ORDER — METHOCARBAMOL 500 MG PO TABS
500.0000 mg | ORAL_TABLET | Freq: Four times a day (QID) | ORAL | Status: DC | PRN
  Administered 2020-05-18 (×2): 500 mg via ORAL
  Filled 2020-05-17 (×2): qty 1

## 2020-05-17 MED ORDER — HYDROMORPHONE HCL 1 MG/ML IJ SOLN: 0.5000 mg | INTRAMUSCULAR | Status: DC | PRN

## 2020-05-17 MED ORDER — SODIUM CHLORIDE 0.9 % IV SOLN: INTRAVENOUS | Status: DC

## 2020-05-17 MED ORDER — ACETAMINOPHEN 325 MG PO TABS: 325.0000 mg | ORAL_TABLET | Freq: Four times a day (QID) | ORAL | Status: DC | PRN

## 2020-05-17 MED ORDER — GABAPENTIN 300 MG PO CAPS
600.0000 mg | ORAL_CAPSULE | Freq: Every day | ORAL | Status: DC
  Administered 2020-05-17: 600 mg via ORAL
  Filled 2020-05-17: qty 2

## 2020-05-17 MED ORDER — PHENYLEPHRINE HCL-NACL 10-0.9 MG/250ML-% IV SOLN
INTRAVENOUS | Status: DC | PRN
  Administered 2020-05-17: 25 ug/min via INTRAVENOUS

## 2020-05-17 MED ORDER — ONDANSETRON HCL 4 MG/2ML IJ SOLN
INTRAMUSCULAR | Status: AC
  Filled 2020-05-17: qty 2

## 2020-05-17 MED ORDER — METOCLOPRAMIDE HCL 5 MG PO TABS: 5.0000 mg | ORAL_TABLET | Freq: Three times a day (TID) | ORAL | Status: DC | PRN

## 2020-05-17 MED ORDER — FENTANYL CITRATE (PF) 100 MCG/2ML IJ SOLN: 50.0000 ug | Freq: Once | INTRAMUSCULAR | Status: AC

## 2020-05-17 MED ORDER — FENTANYL CITRATE (PF) 100 MCG/2ML IJ SOLN
INTRAMUSCULAR | Status: DC | PRN
  Administered 2020-05-17 (×2): 50 ug via INTRAVENOUS

## 2020-05-17 MED ORDER — ORAL CARE MOUTH RINSE: 15.0000 mL | Freq: Once | OROMUCOSAL | Status: AC

## 2020-05-17 MED ORDER — AMLODIPINE BESYLATE 5 MG PO TABS
10.0000 mg | ORAL_TABLET | Freq: Every morning | ORAL | Status: DC
  Administered 2020-05-18: 10 mg via ORAL
  Filled 2020-05-17: qty 2

## 2020-05-17 MED ORDER — POVIDONE-IODINE 10 % EX SWAB: 2.0000 "application " | Freq: Once | CUTANEOUS | Status: DC

## 2020-05-17 MED ORDER — ONDANSETRON HCL 4 MG PO TABS: 4.0000 mg | ORAL_TABLET | Freq: Four times a day (QID) | ORAL | Status: DC | PRN

## 2020-05-17 MED ORDER — PHENOL 1.4 % MT LIQD: 1.0000 | OROMUCOSAL | Status: DC | PRN

## 2020-05-17 MED ORDER — CLONIDINE HCL (ANALGESIA) 100 MCG/ML EP SOLN
EPIDURAL | Status: DC | PRN
  Administered 2020-05-17: 100 ug

## 2020-05-17 MED ORDER — FENTANYL CITRATE (PF) 100 MCG/2ML IJ SOLN
INTRAMUSCULAR | Status: AC
  Administered 2020-05-17: 50 ug via INTRAVENOUS
  Filled 2020-05-17: qty 2

## 2020-05-17 MED ORDER — ACETAMINOPHEN 500 MG PO TABS
1000.0000 mg | ORAL_TABLET | Freq: Once | ORAL | Status: AC
  Administered 2020-05-17: 1000 mg via ORAL
  Filled 2020-05-17: qty 2

## 2020-05-17 MED ORDER — BUPIVACAINE-MELOXICAM ER 200-6 MG/7ML IJ SOLN
INTRAMUSCULAR | Status: AC
  Filled 2020-05-17: qty 2

## 2020-05-17 MED ORDER — ACETAMINOPHEN 500 MG PO TABS
1000.0000 mg | ORAL_TABLET | Freq: Four times a day (QID) | ORAL | Status: AC
  Administered 2020-05-17 – 2020-05-18 (×4): 1000 mg via ORAL
  Filled 2020-05-17 (×4): qty 2

## 2020-05-17 MED ORDER — OXYCODONE HCL 5 MG PO TABS: 10.0000 mg | ORAL_TABLET | ORAL | Status: DC | PRN

## 2020-05-17 MED ORDER — TRAZODONE HCL 100 MG PO TABS
100.0000 mg | ORAL_TABLET | Freq: Every day | ORAL | Status: DC
  Administered 2020-05-17: 100 mg via ORAL
  Filled 2020-05-17 (×2): qty 1

## 2020-05-17 MED ORDER — FENTANYL CITRATE (PF) 250 MCG/5ML IJ SOLN
INTRAMUSCULAR | Status: AC
  Filled 2020-05-17: qty 5

## 2020-05-17 MED ORDER — IRRISEPT - 450ML BOTTLE WITH 0.05% CHG IN STERILE WATER, USP 99.95% OPTIME
TOPICAL | Status: DC | PRN
  Administered 2020-05-17: 450 mL via TOPICAL

## 2020-05-17 MED ORDER — PANTOPRAZOLE SODIUM 40 MG PO TBEC
40.0000 mg | DELAYED_RELEASE_TABLET | Freq: Every day | ORAL | Status: DC
  Administered 2020-05-18: 40 mg via ORAL
  Filled 2020-05-17: qty 1

## 2020-05-17 MED ORDER — EPHEDRINE SULFATE-NACL 50-0.9 MG/10ML-% IV SOSY
PREFILLED_SYRINGE | INTRAVENOUS | Status: DC | PRN
  Administered 2020-05-17 (×2): 10 mg via INTRAVENOUS

## 2020-05-17 MED ORDER — METOCLOPRAMIDE HCL 5 MG/ML IJ SOLN: 5.0000 mg | Freq: Three times a day (TID) | INTRAMUSCULAR | Status: DC | PRN

## 2020-05-17 MED ORDER — BUPIVACAINE IN DEXTROSE 0.75-8.25 % IT SOLN
INTRATHECAL | Status: DC | PRN
  Administered 2020-05-17: 1.6 mL via INTRATHECAL

## 2020-05-17 MED ORDER — MIDAZOLAM HCL 2 MG/2ML IJ SOLN
INTRAMUSCULAR | Status: AC
  Administered 2020-05-17: 1 mg via INTRAVENOUS
  Filled 2020-05-17: qty 2

## 2020-05-17 MED ORDER — ONDANSETRON HCL 4 MG/2ML IJ SOLN
INTRAMUSCULAR | Status: DC | PRN
  Administered 2020-05-17: 4 mg via INTRAVENOUS

## 2020-05-17 MED ORDER — 0.9 % SODIUM CHLORIDE (POUR BTL) OPTIME
TOPICAL | Status: DC | PRN
  Administered 2020-05-17: 1000 mL

## 2020-05-17 MED ORDER — OXYCODONE HCL ER 10 MG PO T12A
10.0000 mg | EXTENDED_RELEASE_TABLET | Freq: Two times a day (BID) | ORAL | Status: DC
  Administered 2020-05-17 – 2020-05-18 (×3): 10 mg via ORAL
  Filled 2020-05-17 (×3): qty 1

## 2020-05-17 MED ORDER — TRANEXAMIC ACID-NACL 1000-0.7 MG/100ML-% IV SOLN
1000.0000 mg | Freq: Once | INTRAVENOUS | Status: AC
  Administered 2020-05-17: 1000 mg via INTRAVENOUS
  Filled 2020-05-17: qty 100

## 2020-05-17 MED ORDER — OXYCODONE HCL 5 MG/5ML PO SOLN: 5.0000 mg | Freq: Once | ORAL | Status: DC | PRN

## 2020-05-17 MED ORDER — FENTANYL CITRATE (PF) 100 MCG/2ML IJ SOLN
25.0000 ug | INTRAMUSCULAR | Status: DC | PRN
  Administered 2020-05-17 (×2): 50 ug via INTRAVENOUS

## 2020-05-17 MED ORDER — DEXAMETHASONE SODIUM PHOSPHATE 10 MG/ML IJ SOLN
INTRAMUSCULAR | Status: DC | PRN
  Administered 2020-05-17: 5 mg via INTRAVENOUS

## 2020-05-17 MED ORDER — ALUM & MAG HYDROXIDE-SIMETH 200-200-20 MG/5ML PO SUSP: 30.0000 mL | ORAL | Status: DC | PRN

## 2020-05-17 MED ORDER — OXYCODONE HCL 5 MG PO TABS: 5.0000 mg | ORAL_TABLET | Freq: Once | ORAL | Status: DC | PRN

## 2020-05-17 MED ORDER — MENTHOL 3 MG MT LOZG: 1.0000 | LOZENGE | OROMUCOSAL | Status: DC | PRN

## 2020-05-17 MED ORDER — BUPIVACAINE-EPINEPHRINE (PF) 0.5% -1:200000 IJ SOLN
INTRAMUSCULAR | Status: DC | PRN
  Administered 2020-05-17: 15 mL via PERINEURAL

## 2020-05-17 MED ORDER — METHOCARBAMOL 1000 MG/10ML IJ SOLN
500.0000 mg | Freq: Four times a day (QID) | INTRAMUSCULAR | Status: DC | PRN
  Filled 2020-05-17: qty 5

## 2020-05-17 MED ORDER — POLYETHYLENE GLYCOL 3350 17 G PO PACK
17.0000 g | PACK | Freq: Every day | ORAL | Status: DC
  Administered 2020-05-18: 17 g via ORAL
  Filled 2020-05-17: qty 1

## 2020-05-17 MED ORDER — VANCOMYCIN HCL 1000 MG IV SOLR
INTRAVENOUS | Status: AC
  Filled 2020-05-17: qty 1000

## 2020-05-17 MED ORDER — LACTATED RINGERS IV SOLN: INTRAVENOUS | Status: DC

## 2020-05-17 MED ORDER — CEFAZOLIN SODIUM-DEXTROSE 2-4 GM/100ML-% IV SOLN
2.0000 g | Freq: Four times a day (QID) | INTRAVENOUS | Status: AC
  Administered 2020-05-17 (×2): 2 g via INTRAVENOUS
  Filled 2020-05-17 (×2): qty 100

## 2020-05-17 MED ORDER — ONDANSETRON HCL 4 MG/2ML IJ SOLN: 4.0000 mg | Freq: Four times a day (QID) | INTRAMUSCULAR | Status: DC | PRN

## 2020-05-17 MED ORDER — CHLORHEXIDINE GLUCONATE 0.12 % MT SOLN
15.0000 mL | Freq: Once | OROMUCOSAL | Status: AC
  Administered 2020-05-17: 15 mL via OROMUCOSAL
  Filled 2020-05-17: qty 15

## 2020-05-17 MED ORDER — MIDAZOLAM HCL 2 MG/2ML IJ SOLN: 1.0000 mg | Freq: Once | INTRAMUSCULAR | Status: AC

## 2020-05-17 MED ORDER — VANCOMYCIN HCL 1000 MG IV SOLR
INTRAVENOUS | Status: DC | PRN
  Administered 2020-05-17: 1000 mg via TOPICAL

## 2020-05-17 MED ORDER — BUPIVACAINE-MELOXICAM ER 400-12 MG/14ML IJ SOLN
INTRAMUSCULAR | Status: DC | PRN
  Administered 2020-05-17 (×2): 400 mg

## 2020-05-17 MED ORDER — PROMETHAZINE HCL 25 MG/ML IJ SOLN: 6.2500 mg | INTRAMUSCULAR | Status: DC | PRN

## 2020-05-17 MED ORDER — SORBITOL 70 % SOLN
30.0000 mL | Freq: Every day | Status: DC | PRN
  Filled 2020-05-17: qty 30

## 2020-05-17 MED ORDER — DIPHENHYDRAMINE HCL 12.5 MG/5ML PO ELIX
25.0000 mg | ORAL_SOLUTION | ORAL | Status: DC | PRN
Start: 2020-05-17 — End: 2020-05-18
  Filled 2020-05-17: qty 10

## 2020-05-17 MED ORDER — DOCUSATE SODIUM 100 MG PO CAPS
100.0000 mg | ORAL_CAPSULE | Freq: Two times a day (BID) | ORAL | Status: DC
  Administered 2020-05-17 – 2020-05-18 (×2): 100 mg via ORAL
  Filled 2020-05-17 (×2): qty 1

## 2020-05-17 SURGICAL SUPPLY — 83 items
ADH SKN CLS APL DERMABOND .7 (GAUZE/BANDAGES/DRESSINGS) ×1
ADH SKN CLS LQ APL DERMABOND (GAUZE/BANDAGES/DRESSINGS) ×1
ALCOHOL 70% 16 OZ (MISCELLANEOUS) ×2 IMPLANT
BAG DECANTER FOR FLEXI CONT (MISCELLANEOUS) ×2 IMPLANT
BANDAGE ESMARK 6X9 LF (GAUZE/BANDAGES/DRESSINGS) IMPLANT
BLADE SAG 18X100X1.27 (BLADE) ×2 IMPLANT
BNDG CMPR 9X6 STRL LF SNTH (GAUZE/BANDAGES/DRESSINGS)
BNDG ESMARK 6X9 LF (GAUZE/BANDAGES/DRESSINGS)
BOWL SMART MIX CTS (DISPOSABLE) ×2 IMPLANT
BSPLAT TIB F 2 PG STRL KN RT (Stem) ×1 IMPLANT
CLSR STERI-STRIP ANTIMIC 1/2X4 (GAUZE/BANDAGES/DRESSINGS) ×4 IMPLANT
COMP FEM CR STD PS SZ 11 (Joint) ×2 IMPLANT
COMPONENT FEM CR STD PS SZ 11 (Joint) IMPLANT
COOLER ICEMAN CLASSIC (MISCELLANEOUS) ×2 IMPLANT
COVER SURGICAL LIGHT HANDLE (MISCELLANEOUS) ×2 IMPLANT
COVER WAND RF STERILE (DRAPES) IMPLANT
CUFF TOURN SGL QUICK 34 (TOURNIQUET CUFF) ×2
CUFF TOURN SGL QUICK 42 (TOURNIQUET CUFF) IMPLANT
CUFF TRNQT CYL 34X4.125X (TOURNIQUET CUFF) ×1 IMPLANT
DERMABOND ADHESIVE PROPEN (GAUZE/BANDAGES/DRESSINGS) ×1
DERMABOND ADVANCED (GAUZE/BANDAGES/DRESSINGS) ×1
DERMABOND ADVANCED .7 DNX12 (GAUZE/BANDAGES/DRESSINGS) ×1 IMPLANT
DERMABOND ADVANCED .7 DNX6 (GAUZE/BANDAGES/DRESSINGS) IMPLANT
DRAPE EXTREMITY T 121X128X90 (DISPOSABLE) ×2 IMPLANT
DRAPE HALF SHEET 40X57 (DRAPES) ×2 IMPLANT
DRAPE INCISE IOBAN 66X45 STRL (DRAPES) IMPLANT
DRAPE ORTHO SPLIT 77X108 STRL (DRAPES) ×4
DRAPE POUCH INSTRU U-SHP 10X18 (DRAPES) ×2 IMPLANT
DRAPE SURG ORHT 6 SPLT 77X108 (DRAPES) ×2 IMPLANT
DRAPE U-SHAPE 47X51 STRL (DRAPES) ×4 IMPLANT
DRSG AQUACEL AG ADV 3.5X10 (GAUZE/BANDAGES/DRESSINGS) ×2 IMPLANT
DURAPREP 26ML APPLICATOR (WOUND CARE) ×6 IMPLANT
ELECT CAUTERY BLADE 6.4 (BLADE) ×2 IMPLANT
ELECT REM PT RETURN 9FT ADLT (ELECTROSURGICAL) ×2
ELECTRODE REM PT RTRN 9FT ADLT (ELECTROSURGICAL) ×1 IMPLANT
GLOVE ECLIPSE 7.0 STRL STRAW (GLOVE) ×6 IMPLANT
GLOVE SKINSENSE NS SZ7.5 (GLOVE) ×3
GLOVE SKINSENSE STRL SZ7.5 (GLOVE) ×3 IMPLANT
GLOVE SURG SYN 7.5  E (GLOVE) ×8
GLOVE SURG SYN 7.5 E (GLOVE) ×4 IMPLANT
GLOVE SURG SYN 7.5 PF PI (GLOVE) ×4 IMPLANT
GLOVE SURG UNDER POLY LF SZ7 (GLOVE) ×2 IMPLANT
GOWN STRL REIN XL XLG (GOWN DISPOSABLE) ×2 IMPLANT
GOWN STRL REUS W/ TWL LRG LVL3 (GOWN DISPOSABLE) ×1 IMPLANT
GOWN STRL REUS W/TWL LRG LVL3 (GOWN DISPOSABLE) ×2
HANDPIECE INTERPULSE COAX TIP (DISPOSABLE) ×2
HDLS TROCR DRIL PIN KNEE 75 (PIN) ×2
HOOD PEEL AWAY FLYTE STAYCOOL (MISCELLANEOUS) ×5 IMPLANT
IMPL PATELLA METAL SZ32X10 (Joint) ×1 IMPLANT
INSERT TIBIA KNEE RIGHT 10 (Joint) ×1 IMPLANT
JET LAVAGE IRRISEPT WOUND (IRRIGATION / IRRIGATOR) ×2
KIT BASIN OR (CUSTOM PROCEDURE TRAY) ×2 IMPLANT
KIT TURNOVER KIT B (KITS) ×2 IMPLANT
LAVAGE JET IRRISEPT WOUND (IRRIGATION / IRRIGATOR) ×1 IMPLANT
MANIFOLD NEPTUNE II (INSTRUMENTS) ×2 IMPLANT
MARKER SKIN DUAL TIP RULER LAB (MISCELLANEOUS) ×2 IMPLANT
NDL SPNL 18GX3.5 QUINCKE PK (NEEDLE) ×2 IMPLANT
NEEDLE SPNL 18GX3.5 QUINCKE PK (NEEDLE) ×4 IMPLANT
NS IRRIG 1000ML POUR BTL (IV SOLUTION) ×2 IMPLANT
PACK TOTAL JOINT (CUSTOM PROCEDURE TRAY) ×2 IMPLANT
PAD ARMBOARD 7.5X6 YLW CONV (MISCELLANEOUS) ×4 IMPLANT
PAD COLD SHLDR WRAP-ON (PAD) ×2 IMPLANT
PIN DRILL HDLS TROCAR 75 4PK (PIN) IMPLANT
SAW OSC TIP CART 19.5X105X1.3 (SAW) ×2 IMPLANT
SCREW FEMALE HEX FIX 25X2.5 (ORTHOPEDIC DISPOSABLE SUPPLIES) ×1 IMPLANT
SET HNDPC FAN SPRY TIP SCT (DISPOSABLE) ×1 IMPLANT
STAPLER VISISTAT 35W (STAPLE) IMPLANT
STEM TIBIAL TRAB SZF RT (Stem) ×1 IMPLANT
SUCTION FRAZIER HANDLE 10FR (MISCELLANEOUS) ×2
SUCTION TUBE FRAZIER 10FR DISP (MISCELLANEOUS) ×1 IMPLANT
SUT ETHILON 2 0 FS 18 (SUTURE) IMPLANT
SUT MNCRL AB 4-0 PS2 18 (SUTURE) IMPLANT
SUT VIC AB 0 CT1 27 (SUTURE) ×4
SUT VIC AB 0 CT1 27XBRD ANBCTR (SUTURE) ×2 IMPLANT
SUT VIC AB 1 CTX 27 (SUTURE) ×6 IMPLANT
SUT VIC AB 2-0 CT1 27 (SUTURE) ×8
SUT VIC AB 2-0 CT1 TAPERPNT 27 (SUTURE) ×4 IMPLANT
SYR 50ML LL SCALE MARK (SYRINGE) ×4 IMPLANT
TOWEL GREEN STERILE (TOWEL DISPOSABLE) ×2 IMPLANT
TOWEL GREEN STERILE FF (TOWEL DISPOSABLE) ×2 IMPLANT
TRAY CATH 16FR W/PLASTIC CATH (SET/KITS/TRAYS/PACK) IMPLANT
UNDERPAD 30X36 HEAVY ABSORB (UNDERPADS AND DIAPERS) ×2 IMPLANT
WRAP KNEE MAXI GEL POST OP (GAUZE/BANDAGES/DRESSINGS) ×2 IMPLANT
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
## 2020-05-17 NOTE — Op Note (Signed)
Total Knee Arthroplasty Procedure Note  Preoperative diagnosis: Right knee osteoarthritis  Postoperative diagnosis:same  Operative procedure: Right total knee arthroplasty. CPT [phone]  Surgeon: N. Glee Arvin, MD  Assist: Oneal Grout, PA-C; necessary for the timely completion of procedure and due to complexity of procedure.  Anesthesia: Spinal, regional  Tourniquet time: 40 mins  Implants used: Zimmer persona pressfit Femur: CR 11 Tibia: F Patella: 32 mm Polyethylene: 10 mm, MC  Indication: Kevin Jennings. is a 55 y.o. year old male with a history of knee pain. Having failed conservative management, the patient elected to proceed with a total knee arthroplasty.  We have reviewed the risk and benefits of the surgery and they elected to proceed after voicing understanding.  Procedure:  After informed consent was obtained and understanding of the risk were voiced including but not limited to bleeding, infection, damage to surrounding structures including nerves and vessels, blood clots, leg length inequality and the failure to achieve desired results, the operative extremity was marked with verbal confirmation of the patient in the holding area.   The patient was then brought to the operating room and transported to the operating room table in the supine position.  A tourniquet was applied to the operative extremity around the upper thigh. The operative limb was then prepped and draped in the usual sterile fashion and preoperative antibiotics were administered.  A time out was performed prior to the start of surgery confirming the correct extremity, preoperative antibiotic administration, as well as team members, implants and instruments available for the case. Correct surgical site was also confirmed with preoperative radiographs. The limb was then elevated for exsanguination and the tourniquet was inflated. A midline incision was made and a standard medial parapatellar  approach was performed.  The patella was everted and prepared and sized to a 32 mm.  A cover was placed on the patella for protection from retractors.  The knee was flexed up which showed degenerative wear of both medial and lateral compartments.  We then turned our attention to the femur. Posterior cruciate ligament was sacrificed. Start site was drilled in the femur and the intramedullary distal femoral cutting guide was placed, set at 5 degrees valgus, taking 10 mm of distal resection. The distal cut was made. Osteophytes were then removed.  Extension gap was then checked. Next, the proximal tibial cutting guide was placed with appropriate slope, varus/valgus alignment and depth of resection. The proximal tibial cut was made. Gap blocks were then used to assess the extension gap and alignment, and appropriate soft tissue releases were performed. Attention was turned back to the femur, which was sized using the sizing guide to a size 11. Appropriate rotation of the femoral component was determined using epicondylar axis, Whiteside's line, and assessing the flexion gap under ligament tension. The appropriate size 4-in-1 cutting block was placed and cuts were made.  Posterior femoral osteophytes and uncapped bone were then removed with the curved osteotome.  Trial components were placed, and stability was checked in full extension, mid-flexion, and deep flexion. Proper tibial rotation was determined and marked.  The patella tracked well after a limited lateral release. Trial components were then removed and tibial preparation performed.  The tibia was sized for a size F component.   The bony surfaces were irrigated with a pulse lavage and then dried. The stability of the construct was re-evaluated throughout a range of motion and found to be acceptable. The trial liner was removed, the knee was copiously irrigated,  and the knee was re-evaluated for any excess bone debris. The real polyethylene liner, 10 mm  thick, was inserted and checked to ensure the locking mechanism had engaged appropriately. The tourniquet was deflated and hemostasis was achieved. The wound was irrigated with normal saline.  One gram of vancomycin powder was placed in the surgical bed.  Capsular closure was performed with a #1 vicryl, subcutaneous fat closed with a 0 vicryl suture, then subcutaneous tissue closed with interrupted 2.0 vicryl suture. The skin was then closed with a 2.0 nylon and dermabond. A sterile dressing was applied.  The patient was awakened in the operating room and taken to recovery in stable condition. All sponge, needle, and instrument counts were correct at the end of the case.  Tessa Lerner was necessary for opening, closing, retracting, limb positioning and overall facilitation and completion of the surgery.  Position: supine  Complications: none.  Time Out: performed   Drains/Packing: none  Estimated blood loss: minimal  Returned to Recovery Room: in good condition.   Antibiotics: yes   Mechanical VTE (DVT) Prophylaxis: sequential compression devices, TED thigh-high  Chemical VTE (DVT) Prophylaxis: aspirin  Fluid Replacement  Crystalloid: see anesthesia record Blood: none  FFP: none   Specimens Removed: 1 to pathology   Sponge and Instrument Count Correct? yes   PACU: portable radiograph - knee AP and Lateral   Plan/RTC: Return in 2 weeks for wound check.   Weight Bearing/Load Lower Extremity: full   Implant Name Type Inv. Item Serial No. Manufacturer Lot No. LRB No. Used Action  STEM TIBIAL TRAB SZF RT - [id number] Stem STEM TIBIAL TRAB SZF RT  [employer](ORTH,TRAU,BIO,SG) [id number] Right 1 Implanted  COMP FEM CR STD PS SZ 11 - [id number] Joint COMP FEM CR STD PS SZ 11  [employer](ORTH,TRAU,BIO,SG) [id number] Right 1 Implanted  IMPL PATELLA METAL [id number] - [id number] Joint IMPL PATELLA METAL [id number]  [employer](ORTH,TRAU,BIO,SG) [id number] Right 1 Implanted  INSERT TIBIA KNEE  RIGHT 10 - [id number] Joint INSERT TIBIA KNEE RIGHT 10  ZIMMER [employer]ON(ORTH,TRAU,BIO,SG) [id number] Right 1 Implanted    N. Glee Arvin, MD [facility] 11:52 AM

## 2020-05-17 NOTE — Transfer of Care (Signed)
Immediate Anesthesia Transfer of Care Note  Patient: Kevin Jennings.  Procedure(s) Performed: RIGHT TOTAL KNEE ARTHROPLASTY (Right Knee)  Patient Location: PACU  Anesthesia Type:MAC combined with regional for post-op pain  Level of Consciousness: awake, alert  and oriented  Airway & Oxygen Therapy: Patient Spontanous Breathing and Patient connected to face mask oxygen  Post-op Assessment: Report given to RN, Post -op Vital signs reviewed and stable and Patient moving all extremities X 4  Post vital signs: Reviewed and stable  Last Vitals:  Vitals Value Taken Time  BP 103/76 05/17/20 1231  Temp    Pulse 65 05/17/20 1235  Resp 11 05/17/20 1235  SpO2 99 % 05/17/20 1235  Vitals shown include unvalidated device data.  Last Pain:  Vitals:   05/17/20 0930  TempSrc:   PainSc: 0-No pain      Patients Stated Pain Goal: 4 (51/02/58 5277)  Complications: No complications documented.

## 2020-05-17 NOTE — Discharge Instructions (Signed)

## 2020-05-17 NOTE — Anesthesia Procedure Notes (Signed)
Spinal  Patient location during procedure: OR Start time: 05/17/2020 10:21 AM End time: 05/17/2020 10:25 AM Reason for block: surgical anesthesia Staffing Performed: anesthesiologist  Anesthesiologist: Kaylyn Layer, MD Preanesthetic Checklist Completed: patient identified, IV checked, risks and benefits discussed, surgical consent, monitors and equipment checked, pre-op evaluation and timeout performed Spinal Block Patient position: sitting Prep: DuraPrep and site prepped and draped Patient monitoring: continuous pulse ox, blood pressure and heart rate Approach: midline Location: L2-3 Injection technique: single-shot Needle Needle type: Pencan  Needle gauge: 24 G Needle length: 9 cm Assessment Events: CSF return Additional Notes Risks, benefits, and alternative discussed. Patient gave consent to procedure. Prepped and draped in sitting position. Patient sedated but responsive to voice. Clear CSF obtained after 2 attempts-somewhat difficult due to habitus and patient positioning (first at L3-4, second at L4-5). Positive terminal aspiration. No pain or paraesthesias with injection. Patient tolerated procedure well. Vital signs stable. Amalia Greenhouse, MD  Note: 9cm spinal needle hubbed at skin with pressure into subcutaneous tissue to obtain CSF, would consider longer needle in future.

## 2020-05-17 NOTE — Anesthesia Procedure Notes (Signed)
Anesthesia Regional Block: Adductor canal block   Pre-Anesthetic Checklist: ,, timeout performed, Correct Patient, Correct Site, Correct Laterality, Correct Procedure, Correct Position, site marked, Risks and benefits discussed, pre-op evaluation,  At surgeon's request and post-op pain management  Laterality: Right  Prep: Maximum Sterile Barrier Precautions used, chloraprep       Needles:  Injection technique: Single-shot  Needle Type: Echogenic Stimulator Needle     Needle Length: 9cm  Needle Gauge: 22     Additional Needles:   Procedures:,,,, ultrasound used (permanent image in chart),,,,  Narrative:  Start time: 05/17/2020 9:19 AM End time: 05/17/2020 9:21 AM Injection made incrementally with aspirations every 5 mL.  Performed by: Personally  Anesthesiologist: Kaylyn Layer, MD  Additional Notes: Risks, benefits, and alternative discussed. Patient gave consent for procedure. Patient prepped and draped in sterile fashion. Sedation administered, patient remains easily responsive to voice. Relevant anatomy identified with ultrasound guidance. Local anesthetic given in 5cc increments with no signs or symptoms of intravascular injection. No pain or paraesthesias with injection. Patient monitored throughout procedure with signs of LAST or immediate complications. Tolerated well. Ultrasound image placed in chart.  Amalia Greenhouse, MD

## 2020-05-17 NOTE — Anesthesia Procedure Notes (Signed)
Procedure Name: MAC Date/Time: 05/17/2020 10:30 AM Performed by: Kyung Rudd, CRNA Pre-anesthesia Checklist: Patient identified, Emergency Drugs available, Suction available and Patient being monitored Patient Re-evaluated:Patient Re-evaluated prior to induction Oxygen Delivery Method: Simple face mask Induction Type: IV induction Placement Confirmation: positive ETCO2 Dental Injury: Teeth and Oropharynx as per pre-operative assessment

## 2020-05-17 NOTE — Anesthesia Postprocedure Evaluation (Signed)
Anesthesia Post Note  Patient: Kevin Jennings.  Procedure(s) Performed: RIGHT TOTAL KNEE ARTHROPLASTY (Right Knee)     Patient location during evaluation: PACU Anesthesia Type: Spinal Level of consciousness: awake and alert and oriented Pain management: pain level controlled Vital Signs Assessment: post-procedure vital signs reviewed and stable Respiratory status: spontaneous breathing, nonlabored ventilation and respiratory function stable Cardiovascular status: blood pressure returned to baseline Postop Assessment: no apparent nausea or vomiting, spinal receding, no headache and no backache Anesthetic complications: no   No complications documented.  Last Vitals:  Vitals:   05/17/20 1300 05/17/20 1315  BP: 113/74 117/85  Pulse: (!) 59 70  Resp: 16 17  Temp:  36.7 C  SpO2: 96% 95%    Last Pain:  Vitals:   05/17/20 1300  TempSrc:   PainSc: 6                  Kaylyn Layer

## 2020-05-17 NOTE — Evaluation (Signed)
Physical Therapy Evaluation Patient Details Name: Kevin Jennings. MRN: [id number] DOB: [birth year] Today's Date: 05/17/2020   History of Present Illness  Pt is 55 yo male s/p R TKA on 05/17/20. PMH includes but not limited to arthritis, GERD, HTN, neuropathy, Bil THA.  Clinical Impression  Pt is s/p TKA resulting in the deficits listed below (see PT Problem List). Pt seen on DOS and was able to ambulate 65' with RW and min guard.  He had great ROM and quad activation.  Reporting pain at 9/10 but with minimal signs of pain.  Pt with decreased safety awareness and impulsive - requiring frequent cues for safety and education on need for adult supervision at home for a few days to a week (preferably 24 hr initially, but at least with mobility). Pt reporting limited support at home, but RN reports the family member that dropped him off said they would stay with him.  Pt will benefit from skilled PT to increase their independence and safety with mobility to allow discharge to the venue listed below.      Follow Up Recommendations Follow surgeon's recommendation for DC plan and follow-up therapies;Supervision for mobility/OOB    Equipment Recommendations  None recommended by PT (has DME)    Recommendations for Other Services       Precautions / Restrictions Precautions Precautions: Fall Restrictions Weight Bearing Restrictions: Yes RLE Weight Bearing: Weight bearing as tolerated      Mobility  Bed Mobility Overal bed mobility: Needs Assistance Bed Mobility: Supine to Sit     Supine to sit: Supervision          Transfers Overall transfer level: Needs assistance Equipment used: None;Rolling walker (2 wheeled) Transfers: Sit to/from Stand Sit to Stand: Supervision         General transfer comment: Pt impulsive and moving quickly - initially stood and took a couple steps without AD.  Pt educated and cued for RW use at all times.  Cued for safe hand placement, R LE management,  safe sitting technique with controlled descent.  Performed 2 more times with RW.  Ambulation/Gait Ambulation/Gait assistance: Min guard Gait Distance (Feet): 80 Feet Assistive device: Rolling walker (2 wheeled);None Gait Pattern/deviations: Step-to pattern;Antalgic;Step-through pattern Gait velocity: decreased   General Gait Details: Pt stood up impulsively and took a couple antalgic steps with decreased weight shift to R without RW.  Cued to wait for RW.  With RW ambulated 66' with step to pattern progressed to step through.  Cued for RW proximity.  Stairs            Wheelchair Mobility    Modified Rankin (Stroke Patients Only)       Balance Overall balance assessment: Needs assistance Sitting-balance support: No upper extremity supported Sitting balance-Leahy Scale: Good     Standing balance support: No upper extremity supported Standing balance-Leahy Scale: Fair Standing balance comment: Pt standing and taking a few steps without support; utilized RW for ambulation when cued                             Pertinent Vitals/Pain Pain Assessment: 0-10 Pain Score: 9  Pain Location: R knee Pain Descriptors / Indicators: Discomfort;Sore Pain Intervention(s): Limited activity within patient's tolerance;Monitored during session;Premedicated before session (reports 9/10 pain, but moving well and minimal signs of pain)    Home Living Family/patient expects to be discharged to:: Private residence Living Arrangements: Alone Available Help at Discharge: Personal care attendant;Friend(s);Available  PRN/intermittently (personal care attendant 3hr every day; reports has 27 yo niece ; asked about adult helping and pt not sure - RN reports family member that visited did say they would stay with him) Type of Home: Apartment Home Access: Level entry;Other (comment) (no steps but does have to walk downhill)     Home Layout: One level Home Equipment: Walker - 2 wheels;Bedside  commode;Wheelchair - power;Shower seat      Prior Function Level of Independence: Needs assistance   Gait / Transfers Assistance Needed: Pt uses cane for short distance and power wheelchair for longer distance; reports limited by bil hip pain and R knee pain  ADL's / Homemaking Assistance Needed: Pt had difficulty with lower body ADLs that aide assisted with.  Additionally, aide assisted with IADLs.  Pt could do some light IADLs and shopping using power wheel chair        Hand Dominance   Dominant Hand: Right    Extremity/Trunk Assessment   Upper Extremity Assessment Upper Extremity Assessment: Overall WFL for tasks assessed    Lower Extremity Assessment Lower Extremity Assessment: LLE deficits/detail;RLE deficits/detail RLE Deficits / Details: Expected post op changes; ROM : ankle and hip WFL, Knee 0 to 85 degrees; MMT: ankle 5/5, hip and knee 3/5 not further tested RLE Sensation: WNL LLE Deficits / Details: ROM WFL; MMT 5/5 LLE Sensation: WNL       Communication   Communication: No difficulties  Cognition Arousal/Alertness: Awake/alert Behavior During Therapy: Impulsive Overall Cognitive Status: No family/caregiver present to determine baseline cognitive functioning                                 General Comments: Likely at baseline; decreased safey awareness - moves impulsively, getting up without RW, reports he will ask his niece (who is 34 yo) to assist at d/c, when educated want someone there for safety/prevent falls - stated he had life alert button      General Comments General comments (skin integrity, edema, etc.): Replaced ice cuff - educated on safe ice use.  Also educated on PT role, POC, need for 24 hr (or at least with mobility) supervision from adult for a few days, resting with leg straight, ankle pumps, and quad sets    Exercises Total Joint Exercises Ankle Circles/Pumps: AROM;Both;5 reps;Seated Quad Sets: AROM;Both;5 reps;Supine    Assessment/Plan    PT Assessment Patient needs continued PT services  PT Problem List Decreased strength;Decreased mobility;Decreased safety awareness;Decreased range of motion;Decreased knowledge of precautions;Decreased activity tolerance;Decreased balance;Decreased knowledge of use of DME;Pain       PT Treatment Interventions DME instruction;Therapeutic activities;Modalities;Gait training;Therapeutic exercise;Patient/family education;Stair training;Functional mobility training;Balance training    PT Goals (Current goals can be found in the Care Plan section)  Acute Rehab PT Goals Patient Stated Goal: return home PT Goal Formulation: With patient Time For Goal Achievement: 05/31/20 Potential to Achieve Goals: Good    Frequency 7X/week   Barriers to discharge Decreased caregiver support      Co-evaluation               AM-PAC PT "6 Clicks" Mobility  Outcome Measure Help needed turning from your back to your side while in a flat bed without using bedrails?: None Help needed moving from lying on your back to sitting on the side of a flat bed without using bedrails?: None Help needed moving to and from a bed to a chair (including a  wheelchair)?: A Little Help needed standing up from a chair using your arms (e.g., wheelchair or bedside chair)?: A Little Help needed to walk in hospital room?: A Little Help needed climbing 3-5 steps with a railing? : A Little 6 Click Score: 20    End of Session Equipment Utilized During Treatment: Gait belt Activity Tolerance: Patient tolerated treatment well Patient left: in chair;with call bell/phone within reach (on 3 C - do not have chair alarms.  Educated on calling for assist, pt reports will not get up on his own) Nurse Communication: Mobility status PT Visit Diagnosis: Other abnormalities of gait and mobility (R26.89);Muscle weakness (generalized) (M62.81)    Time: 0240-9735 PT Time Calculation (min) (ACUTE ONLY): 30  min   Charges:   PT Evaluation $PT Eval Low Complexity: 1 Low PT Treatments $Gait Training: 8-22 mins        Anise Salvo, PT Acute Rehab Services Pager [phone] [facility] [phone]    Rayetta Humphrey 05/17/2020, 5:37 PM

## 2020-05-18 ENCOUNTER — Encounter (HOSPITAL_COMMUNITY): Payer: Self-pay | Admitting: Orthopaedic Surgery

## 2020-05-18 DIAGNOSIS — M1711 Unilateral primary osteoarthritis, right knee: Secondary | ICD-10-CM | POA: Diagnosis not present

## 2020-05-18 LAB — BASIC METABOLIC PANEL
Anion gap: 5 (ref 5–15)
BUN: 14 mg/dL (ref 6–20)
CO2: 26 mmol/L (ref 22–32)
Calcium: 9.3 mg/dL (ref 8.9–10.3)
Chloride: 104 mmol/L (ref 98–111)
Creatinine, Ser: 1.15 mg/dL (ref 0.61–1.24)
GFR, Estimated: >60 mL/min (ref >60)
Glucose, Bld: 164 mg/dL — ABNORMAL HIGH (ref 70–99)
Potassium: 4.5 mmol/L (ref 3.5–5.1)
Sodium: 135 mmol/L (ref 135–145)

## 2020-05-18 LAB — CBC
HCT: 36.4 % — ABNORMAL LOW (ref 39.0–52.0)
Hemoglobin: 12.4 g/dL — ABNORMAL LOW (ref 13.0–17.0)
MCH: 29.7 pg (ref 26.0–34.0)
MCHC: 34.1 g/dL (ref 30.0–36.0)
MCV: 87.3 fL (ref 80.0–100.0)
Platelets: 229 10*3/uL (ref 150–400)
RBC: 4.17 MIL/uL — ABNORMAL LOW (ref 4.22–5.81)
RDW: 15.2 % (ref 11.5–15.5)
WBC: 10.4 10*3/uL (ref 4.0–10.5)
nRBC: 0 % (ref 0.0–0.2)

## 2020-05-18 NOTE — Progress Notes (Signed)
Physical Therapy Treatment Patient Details Name: Kevin Jennings. MRN: [id number] DOB: 16-Oct-1965 Today's Date: 05/18/2020    History of Present Illness Pt is 54 yo male s/p R TKA on 05/17/20. PMH includes but not limited to arthritis, GERD, HTN, neuropathy, Bil THA.    PT Comments    Pt reporting increased R knee pain and stiffness this afternoon, but agreeable to second PT session prior to d/c. Pt tolerated gait in hallway well with mod cuing needed for form and safety during mobility. Pt also completed TKR exercises, with light PT assist to complete some exercises (heel slides, SLR). Pt eager to d/c home with family, all questions answered.    Follow Up Recommendations  Follow surgeon's recommendation for DC plan and follow-up therapies;Supervision for mobility/OOB     Equipment Recommendations  None recommended by PT    Recommendations for Other Services       Precautions / Restrictions Precautions Precautions: Fall Restrictions Weight Bearing Restrictions: No RLE Weight Bearing: Weight bearing as tolerated    Mobility  Bed Mobility Overal bed mobility: Needs Assistance Bed Mobility: Supine to Sit;Sit to Supine     Supine to sit: Supervision Sit to supine: Supervision   General bed mobility comments: for safety, increased time secondary to R knee pain    Transfers Overall transfer level: Needs assistance Equipment used: Rolling walker (2 wheeled) Transfers: Sit to/from Stand Sit to Stand: Supervision         General transfer comment: for safety, verbal cuing for hand placement when rising/sitting. STS x2 from EOB.  Ambulation/Gait Ambulation/Gait assistance: Supervision Gait Distance (Feet): 150 Feet Assistive device: Rolling walker (2 wheeled) Gait Pattern/deviations: Step-through pattern;Decreased stride length;Trunk flexed;Antalgic Gait velocity: decr   General Gait Details: supervision for safety, multimodal cuing for upright posture, placement in  RW. Pt with significant RLE limp initially, improving with increased gait distance due to knee "loosening up"   Stairs             Wheelchair Mobility    Modified Rankin (Stroke Patients Only)       Balance Overall balance assessment: Needs assistance Sitting-balance support: No upper extremity supported Sitting balance-Leahy Scale: Good     Standing balance support: No upper extremity supported Standing balance-Leahy Scale: Fair Standing balance comment: able to stand statically without RW support, reliant on external support in dynamic standing                            Cognition Arousal/Alertness: Awake/alert Behavior During Therapy: Impulsive Overall Cognitive Status: Within Functional Limits for tasks assessed                                 General Comments: requires multiple safety cues during mobility      Exercises Total Joint Exercises Towel Squeeze: AROM;Both;Supine;5 reps Short Arc Quad: AROM;Right;5 reps;Supine Heel Slides: AAROM;Right;5 reps;Supine Straight Leg Raises: AAROM;Right;5 reps;Supine Goniometric ROM: knee aarom 0-75 limited by pain    General Comments        Pertinent Vitals/Pain Pain Assessment: Faces Faces Pain Scale: Hurts even more Pain Location: R knee Pain Descriptors / Indicators: Discomfort;Sore Pain Intervention(s): Limited activity within patient's tolerance;Monitored during session;Repositioned;Patient requesting pain meds-RN notified    Home Living                      Prior Function  PT Goals (current goals can now be found in the care plan section) Acute Rehab PT Goals Patient Stated Goal: return home PT Goal Formulation: With patient Time For Goal Achievement: 05/31/20 Potential to Achieve Goals: Good Progress towards PT goals: Progressing toward goals    Frequency    7X/week      PT Plan Current plan remains appropriate    Co-evaluation               AM-PAC PT "6 Clicks" Mobility   Outcome Measure  Help needed turning from your back to your side while in a flat bed without using bedrails?: None Help needed moving from lying on your back to sitting on the side of a flat bed without using bedrails?: None Help needed moving to and from a bed to a chair (including a wheelchair)?: None Help needed standing up from a chair using your arms (e.g., wheelchair or bedside chair)?: None Help needed to walk in hospital room?: A Little Help needed climbing 3-5 steps with a railing? : A Little 6 Click Score: 22    End of Session   Activity Tolerance: Patient tolerated treatment well Patient left: with call bell/phone within reach;in bed Nurse Communication: Mobility status PT Visit Diagnosis: Other abnormalities of gait and mobility (R26.89);Muscle weakness (generalized) (M62.81)     Time: 7782-4235 PT Time Calculation (min) (ACUTE ONLY): 24 min  Charges:  $Gait Training: 8-22 mins $Therapeutic Exercise: 8-22 mins                     Marye Round, PT DPT Acute Rehabilitation Services Pager [phone]  Office [phone]    Truddie Coco 05/18/2020, 4:35 PM
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
## 2020-05-18 NOTE — Plan of Care (Signed)
Patient alert and oriented, voiding adequately, skin clean, dry and intact without evidence of skin break down, or symptoms of complications - no redness or edema noted, only slight tenderness at site.  Patient states pain is manageable at time of discharge. Patient has an appointment with MD in 2 weeks 

## 2020-05-18 NOTE — Progress Notes (Signed)
Physical Therapy Treatment Patient Details Name: Kevin Jennings. MRN: [id number] DOB: [birth year] Today's Date: 05/18/2020    History of Present Illness Pt is 55 yo male s/p R TKA on 05/17/20. PMH includes but not limited to arthritis, GERD, HTN, neuropathy, Bil THA.    PT Comments    Pt eager to mobilize, ambulatory in hallway with supervision level assist and multimodal cuing for form/safety. Pt with some impulsivity during mobility, but responds well to cues. PT initiated TKR HEP with pt, and administered handout. Will complete HEP and gait training in second PT session today. Will continue to follow acutely.    Follow Up Recommendations  Follow surgeon's recommendation for DC plan and follow-up therapies;Supervision for mobility/OOB     Equipment Recommendations  None recommended by PT    Recommendations for Other Services       Precautions / Restrictions Precautions Precautions: Fall Restrictions Weight Bearing Restrictions: No RLE Weight Bearing: Weight bearing as tolerated    Mobility  Bed Mobility Overal bed mobility: Needs Assistance Bed Mobility: Supine to Sit     Supine to sit: Supervision     General bed mobility comments: supervision for safety, increased time and effort to perform bed mobility tasks.    Transfers Overall transfer level: Needs assistance Equipment used: Rolling walker (2 wheeled) Transfers: Sit to/from Stand Sit to Stand: Supervision         General transfer comment: for safety, verbal cuing for hand placement when rising/sitting  Ambulation/Gait Ambulation/Gait assistance: Supervision Gait Distance (Feet): 200 Feet Assistive device: Rolling walker (2 wheeled) Gait Pattern/deviations: Step-through pattern;Decreased stride length;Trunk flexed Gait velocity: decr   General Gait Details: supervision for safety, verbal and tactile cuing for upright posture, placement in RW.   Stairs             Wheelchair Mobility     Modified Rankin (Stroke Patients Only)       Balance Overall balance assessment: Needs assistance Sitting-balance support: No upper extremity supported Sitting balance-Leahy Scale: Good     Standing balance support: No upper extremity supported Standing balance-Leahy Scale: Fair Standing balance comment: able to stand statically without RW support, reliant on external support in dynamic standing                            Cognition Arousal/Alertness: Awake/alert Behavior During Therapy: Impulsive Overall Cognitive Status: Within Functional Limits for tasks assessed                                        Exercises Total Joint Exercises Ankle Circles/Pumps: AROM;Both;10 reps;Seated Quad Sets: AROM;Right;10 reps;Seated (with posterior knee tactile cuing) Hip ABduction/ADduction: AROM;Right;10 reps;Seated Knee Flexion: AAROM;Right;Seated (x1 sustained hold x10 seconds) Goniometric ROM: 5-90* knee ext/flex limited by pain and stiffness    General Comments        Pertinent Vitals/Pain Pain Assessment: 0-10 Pain Score: 8  Pain Location: R knee Pain Descriptors / Indicators: Discomfort;Sore Pain Intervention(s): Limited activity within patient's tolerance;Monitored during session;Repositioned    Home Living                      Prior Function            PT Goals (current goals can now be found in the care plan section) Acute Rehab PT Goals Patient Stated Goal: return home PT Goal  Formulation: With patient Time For Goal Achievement: 05/31/20 Potential to Achieve Goals: Good Progress towards PT goals: Progressing toward goals    Frequency    7X/week      PT Plan Current plan remains appropriate    Co-evaluation              AM-PAC PT "6 Clicks" Mobility   Outcome Measure  Help needed turning from your back to your side while in a flat bed without using bedrails?: None Help needed moving from lying on your back  to sitting on the side of a flat bed without using bedrails?: None Help needed moving to and from a bed to a chair (including a wheelchair)?: None Help needed standing up from a chair using your arms (e.g., wheelchair or bedside chair)?: None Help needed to walk in hospital room?: A Little Help needed climbing 3-5 steps with a railing? : A Little 6 Click Score: 22    End of Session   Activity Tolerance: Patient tolerated treatment well Patient left: in chair;with call bell/phone within reach Nurse Communication: Mobility status PT Visit Diagnosis: Other abnormalities of gait and mobility (R26.89);Muscle weakness (generalized) (M62.81)     Time: 5035-4656 PT Time Calculation (min) (ACUTE ONLY): 20 min  Charges:  $Gait Training: 8-22 mins                     Marye Round, PT DPT Acute Rehabilitation Services Pager [phone]  Office [phone]   Tyrone Apple E Christain [place] 05/18/2020, 10:27 AM
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
## 2020-05-18 NOTE — Progress Notes (Signed)
Orthopedic Tech Progress Note Patient Details:  Kevin Jennings July 18, 1965 [id number]  Patient ID: [name]., male   DOB: August 23, 1965, 55 y.o.   MRN: [id number] Applied cpm 0-60  Trinna Post 05/18/2020, 6:15 AM

## 2020-05-18 NOTE — Progress Notes (Signed)
Subjective: 1 Day Post-Op Procedure(s) (LRB): RIGHT TOTAL KNEE ARTHROPLASTY (Right) Patient reports pain as mild.    Objective: Vital signs in last 24 hours: Temp:  [97.6 F (36.4 C)-98.1 F (36.7 C)] 98.1 F (36.7 C) (04/26 0730) Pulse Rate:  [59-89] 73 (04/26 0730) Resp:  [6-20] 18 (04/26 0730) BP: (103-167)/(74-98) 114/82 (04/26 0730) SpO2:  [94 %-100 %] 95 % (04/26 0730)  Intake/Output from previous day: 04/25 0701 - 04/26 0700 In: 750 [I.V.:700; IV Piggyback:50] Out: 500 [Urine:450; Blood:50] Intake/Output this shift: No intake/output data recorded.  Recent Labs    05/18/20 0439  HGB 12.4*   Recent Labs    05/18/20 0439  WBC 10.4  RBC 4.17*  HCT 36.4*  PLT 229   Recent Labs    05/18/20 0439  NA 135  K 4.5  CL 104  CO2 26  BUN 14  CREATININE 1.15  GLUCOSE 164*  CALCIUM 9.3   No results for input(s): LABPT, INR in the last 72 hours.  Neurologically intact Neurovascular intact Sensation intact distally Intact pulses distally Dorsiflexion/Plantar flexion intact Incision: scant drainage No cellulitis present Compartment soft   Assessment/Plan: 1 Day Post-Op Procedure(s) (LRB): RIGHT TOTAL KNEE ARTHROPLASTY (Right) Advance diet Up with therapy D/C IV fluids Discharge home with home health after second PT session WBAT RLE    Anticipated LOS equal to or greater than 2 midnights due to - Age 55 and older with one or more of the following:  - Obesity  - Expected need for hospital services (PT, OT, Nursing) required for safe  discharge  - Anticipated need for postoperative skilled nursing care or inpatient rehab  - Active co-morbidities: Diabetes OR   - Unanticipated findings during/Post Surgery: None  - Patient is a high risk of re-admission due to: None    Cristie Hem 05/18/2020, 8:11 AM

## 2020-05-25 ENCOUNTER — Inpatient Hospital Stay: Payer: Medicare Other | Admitting: Orthopaedic Surgery

## 2020-05-27 ENCOUNTER — Telehealth: Payer: Self-pay | Admitting: Orthopaedic Surgery

## 2020-05-27 NOTE — Telephone Encounter (Signed)
Pls advise.  

## 2020-05-27 NOTE — Telephone Encounter (Signed)
Pt states the pain medication and muscle relaxers need to be refilled and need to be stronger.

## 2020-05-28 ENCOUNTER — Telehealth: Payer: Self-pay

## 2020-05-28 MED ORDER — OXYCODONE-ACETAMINOPHEN 5-325 MG PO TABS: 1.0000 | ORAL_TABLET | Freq: Three times a day (TID) | ORAL | 0 refills | Status: DC | PRN

## 2020-05-28 NOTE — Telephone Encounter (Signed)
Patient aware.

## 2020-05-28 NOTE — Telephone Encounter (Signed)
Would like another Rx. States Oxycodone does not help.  Would like something stronger. States Dr Roda Shutters know what I like.

## 2020-05-28 NOTE — Telephone Encounter (Signed)
done

## 2020-05-28 NOTE — Telephone Encounter (Signed)
Patient called regarding last message he is still waiting for rx to be sent to the pharmacy call back:[phone]

## 2020-05-28 NOTE — Telephone Encounter (Signed)
Pt states the pain medication and muscle relaxers need to be refilled and need to be stronger.  

## 2020-05-31 ENCOUNTER — Other Ambulatory Visit: Payer: Self-pay | Admitting: Physician Assistant

## 2020-05-31 MED ORDER — OXYCODONE-ACETAMINOPHEN 7.5-325 MG PO TABS: ORAL_TABLET | ORAL | 0 refills | Status: AC

## 2020-05-31 MED ORDER — METHOCARBAMOL 500 MG PO TABS: 500.0000 mg | ORAL_TABLET | Freq: Two times a day (BID) | ORAL | 0 refills | Status: DC | PRN

## 2020-05-31 NOTE — Telephone Encounter (Signed)
What is he referring to?  I'm not aware.

## 2020-05-31 NOTE — Telephone Encounter (Signed)
Does he need something today or can he wait until tomorrow's appt?

## 2020-05-31 NOTE — Telephone Encounter (Signed)
Anything stronger than Oxycodone you can prescribe per patients request.  He has a scheduled appt tomorrow.

## 2020-05-31 NOTE — Telephone Encounter (Signed)
He states it can wait for tomorrows appt.

## 2020-05-31 NOTE — Telephone Encounter (Signed)
Sent in

## 2020-06-01 ENCOUNTER — Ambulatory Visit (INDEPENDENT_AMBULATORY_CARE_PROVIDER_SITE_OTHER): Payer: Medicare Other | Admitting: Physician Assistant

## 2020-06-01 ENCOUNTER — Encounter: Payer: Self-pay | Admitting: Orthopaedic Surgery

## 2020-06-01 DIAGNOSIS — Z96651 Presence of right artificial knee joint: Secondary | ICD-10-CM
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
## 2020-06-04 ENCOUNTER — Telehealth: Payer: Self-pay

## 2020-06-04 NOTE — Telephone Encounter (Signed)
Faxed TED HOSE order to [phone] Per patients request

## 2020-06-16 ENCOUNTER — Telehealth: Payer: Self-pay | Admitting: Orthopaedic Surgery

## 2020-06-16 NOTE — Telephone Encounter (Signed)
Patient called needing Rx refilled Oxycodone and also the muscle relaxer. The number to contact patient is [phone]
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
## 2020-06-17 ENCOUNTER — Other Ambulatory Visit: Payer: Self-pay | Admitting: Physician Assistant

## 2020-06-17 MED ORDER — HYDROCODONE-ACETAMINOPHEN 5-325 MG PO TABS: 1.0000 | ORAL_TABLET | Freq: Two times a day (BID) | ORAL | 0 refills | Status: DC | PRN

## 2020-06-17 MED ORDER — METHOCARBAMOL 500 MG PO TABS: 500.0000 mg | ORAL_TABLET | Freq: Two times a day (BID) | ORAL | 0 refills | Status: AC | PRN

## 2020-06-17 NOTE — Telephone Encounter (Signed)
Patient aware.

## 2020-06-17 NOTE — Telephone Encounter (Signed)
Sent in robaxin and weaning to norco which I just sent in

## 2020-06-29 ENCOUNTER — Other Ambulatory Visit: Payer: Self-pay | Admitting: Physician Assistant

## 2020-06-29 ENCOUNTER — Encounter: Payer: Medicare Other | Admitting: Orthopaedic Surgery

## 2020-06-29 ENCOUNTER — Telehealth: Payer: Self-pay | Admitting: Orthopaedic Surgery

## 2020-06-29 MED ORDER — HYDROCODONE-ACETAMINOPHEN 5-325 MG PO TABS: 1.0000 | ORAL_TABLET | Freq: Two times a day (BID) | ORAL | 0 refills | Status: DC | PRN

## 2020-06-29 NOTE — Telephone Encounter (Signed)
Pt called and would like a refill on oxycodone. He is wondering if you could send it in to [facility] [address] [place], [place], [place] [address]  [phone]. He is in a lot of pain and don't want to have to wait for it to come by mail.   Cb [phone]

## 2020-06-29 NOTE — Telephone Encounter (Signed)
Weaning to norco and I just sent in

## 2020-06-30 NOTE — Telephone Encounter (Signed)
Called patient LMOM. No answer.   Rx sent to pharm.

## 2020-07-06 ENCOUNTER — Encounter: Payer: Self-pay | Admitting: Orthopaedic Surgery

## 2020-07-06 ENCOUNTER — Ambulatory Visit: Payer: Self-pay

## 2020-07-06 ENCOUNTER — Ambulatory Visit (INDEPENDENT_AMBULATORY_CARE_PROVIDER_SITE_OTHER): Payer: Medicare Other | Admitting: Orthopaedic Surgery

## 2020-07-06 VITALS — Ht 70.0 in | Wt 269.0 lb

## 2020-07-06 DIAGNOSIS — Z96651 Presence of right artificial knee joint: Secondary | ICD-10-CM

## 2020-07-06 MED ORDER — HYDROCODONE-ACETAMINOPHEN 5-325 MG PO TABS: 1.0000 | ORAL_TABLET | Freq: Every day | ORAL | 0 refills | Status: DC | PRN

## 2020-07-06 MED ORDER — IBUPROFEN 800 MG PO TABS: 800.0000 mg | ORAL_TABLET | Freq: Three times a day (TID) | ORAL | 2 refills | Status: AC | PRN
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
## 2020-08-05 ENCOUNTER — Telehealth: Payer: Self-pay | Admitting: Orthopaedic Surgery

## 2020-08-05 NOTE — Telephone Encounter (Signed)
Please advise 

## 2020-08-05 NOTE — Telephone Encounter (Signed)
Pt calling for a refill on his prescription of hydrocodone. The best pharmacy is [facility] ([place], [place] - [address]) and the best call back number is [phone].

## 2020-08-06 ENCOUNTER — Other Ambulatory Visit: Payer: Self-pay | Admitting: Physician Assistant

## 2020-08-06 MED ORDER — HYDROCODONE-ACETAMINOPHEN 5-325 MG PO TABS: 1.0000 | ORAL_TABLET | Freq: Every day | ORAL | 0 refills | Status: DC | PRN

## 2020-08-06 MED ORDER — HYDROCODONE-ACETAMINOPHEN 5-325 MG PO TABS: 1.0000 | ORAL_TABLET | Freq: Every day | ORAL | 0 refills | Status: AC | PRN

## 2020-08-06 NOTE — Telephone Encounter (Signed)
Sent in last narcotic rx

## 2020-08-06 NOTE — Telephone Encounter (Signed)
I called pt and he stated understanding. He stated that the rx was sent to the wrong pharmacy and said we need to redo it. The correct pharmacy is listed in message.

## 2020-08-06 NOTE — Telephone Encounter (Signed)
Sent in
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
## 2020-08-17 ENCOUNTER — Ambulatory Visit (INDEPENDENT_AMBULATORY_CARE_PROVIDER_SITE_OTHER): Payer: Medicare Other | Admitting: Orthopaedic Surgery

## 2020-08-17 ENCOUNTER — Ambulatory Visit: Payer: Self-pay

## 2020-08-17 ENCOUNTER — Encounter: Payer: Self-pay | Admitting: Orthopaedic Surgery

## 2020-08-17 DIAGNOSIS — Z96651 Presence of right artificial knee joint: Secondary | ICD-10-CM

## 2020-08-17 MED ORDER — TRAMADOL HCL 50 MG PO TABS: 50.0000 mg | ORAL_TABLET | Freq: Two times a day (BID) | ORAL | 2 refills | Status: DC | PRN
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
## 2020-08-18 DIAGNOSIS — Z0189 Encounter for other specified special examinations: Secondary | ICD-10-CM

## 2020-08-18 LAB — GLUCOSE, POCT (MANUAL RESULT ENTRY): POC Glucose: 91 mg/dl (ref 70–99)
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
## 2020-10-14 ENCOUNTER — Telehealth: Payer: Self-pay | Admitting: Orthopaedic Surgery

## 2020-10-14 NOTE — Telephone Encounter (Signed)
Pt wants refill on tramadol

## 2020-10-15 ENCOUNTER — Telehealth: Payer: Self-pay | Admitting: Orthopaedic Surgery

## 2020-10-15 ENCOUNTER — Other Ambulatory Visit: Payer: Self-pay | Admitting: Physician Assistant

## 2020-10-15 MED ORDER — TRAMADOL HCL 50 MG PO TABS: 50.0000 mg | ORAL_TABLET | Freq: Two times a day (BID) | ORAL | 2 refills | Status: DC | PRN

## 2020-10-15 NOTE — Telephone Encounter (Signed)
Patient called. He says the pain medication needs to be sent to [id number] - [place],  - [address]

## 2020-10-15 NOTE — Telephone Encounter (Signed)
sent 

## 2020-10-18 ENCOUNTER — Other Ambulatory Visit: Payer: Self-pay | Admitting: Physician Assistant

## 2020-10-18 MED ORDER — TRAMADOL HCL 50 MG PO TABS: 50.0000 mg | ORAL_TABLET | Freq: Two times a day (BID) | ORAL | 2 refills | Status: DC | PRN

## 2020-10-18 NOTE — Telephone Encounter (Signed)
sent 
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
## 2020-11-04 ENCOUNTER — Telehealth: Payer: Self-pay | Admitting: Radiology
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
## 2020-11-13 ENCOUNTER — Other Ambulatory Visit: Payer: Self-pay | Admitting: Physician Assistant

## 2020-11-15 ENCOUNTER — Telehealth: Payer: Self-pay | Admitting: Orthopaedic Surgery

## 2020-11-15 NOTE — Telephone Encounter (Signed)
Pt would like refill on tramadol.   CB [phone]

## 2020-11-16 ENCOUNTER — Other Ambulatory Visit: Payer: Self-pay | Admitting: Physician Assistant

## 2020-11-16 MED ORDER — TRAMADOL HCL 50 MG PO TABS: 50.0000 mg | ORAL_TABLET | Freq: Two times a day (BID) | ORAL | 2 refills | Status: DC | PRN

## 2020-11-16 NOTE — Telephone Encounter (Signed)
Sent in

## 2020-11-17 ENCOUNTER — Ambulatory Visit: Payer: Self-pay

## 2020-11-17 ENCOUNTER — Other Ambulatory Visit: Payer: Self-pay

## 2020-11-17 ENCOUNTER — Ambulatory Visit (INDEPENDENT_AMBULATORY_CARE_PROVIDER_SITE_OTHER): Payer: Medicare Other | Admitting: Orthopaedic Surgery

## 2020-11-17 DIAGNOSIS — Z96651 Presence of right artificial knee joint: Secondary | ICD-10-CM
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
## 2020-11-24 ENCOUNTER — Ambulatory Visit: Payer: Medicare Other | Admitting: Physical Therapy

## 2020-11-29 ENCOUNTER — Ambulatory Visit: Payer: Medicare Other | Attending: Physician Assistant | Admitting: Physical Therapy

## 2020-11-29 ENCOUNTER — Encounter: Payer: Medicare Other | Admitting: Physical Therapy

## 2020-11-29 ENCOUNTER — Telehealth: Payer: Self-pay | Admitting: Physical Therapy

## 2020-11-29 NOTE — Telephone Encounter (Signed)
Called pt to inquiry about his absence from his apt. He did not respond to call, so VM instructing him to call back.

## 2020-12-01 ENCOUNTER — Ambulatory Visit: Payer: Medicare Other | Admitting: Physical Therapy
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
## 2020-12-07 ENCOUNTER — Encounter: Payer: Medicare Other | Admitting: Physical Therapy
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
## 2020-12-09 ENCOUNTER — Encounter: Payer: Medicare Other | Admitting: Physical Therapy

## 2020-12-14 ENCOUNTER — Encounter: Payer: Medicare Other | Admitting: Physical Therapy

## 2020-12-21 ENCOUNTER — Encounter: Payer: Medicare Other | Admitting: Physical Therapy
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
## 2020-12-23 ENCOUNTER — Encounter: Payer: Medicare Other | Admitting: Physical Therapy
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
## 2021-03-14 ENCOUNTER — Telehealth: Payer: Self-pay | Admitting: Orthopaedic Surgery

## 2021-03-14 NOTE — Telephone Encounter (Signed)
Please advise 

## 2021-03-14 NOTE — Telephone Encounter (Signed)
Pt called requesting a refill of Tramadol. I COULD NOT UNDERSTAND WHICH [facility] PATIENT WANT THE MEDICATION TO GO TO. HE WANTS [facility] BUT CHANGED THE LOCATION. PLEASE CALL PT AT [phone].

## 2021-03-15 NOTE — Telephone Encounter (Signed)
Can you call in refill with same previous instructions and send to which pharmacy he wants.  Sounds like may need to call him back

## 2021-03-16 DIAGNOSIS — Z008 Encounter for other general examination: Secondary | ICD-10-CM

## 2021-03-16 LAB — GLUCOSE, POCT (MANUAL RESULT ENTRY): POC Glucose: 173 mg/dl — AB (ref 70–99)

## 2021-03-16 NOTE — Telephone Encounter (Signed)
Need to know which pharmacy he uses? Called patient no answer. States call cannot be completed at this time.
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
## 2021-03-17 ENCOUNTER — Telehealth: Payer: Self-pay | Admitting: Orthopaedic Surgery

## 2021-03-17 ENCOUNTER — Other Ambulatory Visit: Payer: Self-pay | Admitting: Physician Assistant

## 2021-03-17 MED ORDER — TRAMADOL HCL 50 MG PO TABS: 50.0000 mg | ORAL_TABLET | Freq: Two times a day (BID) | ORAL | 2 refills | Status: DC | PRN

## 2021-03-17 NOTE — Telephone Encounter (Signed)
Sent in

## 2021-03-17 NOTE — Telephone Encounter (Signed)
Patient called advised the [facility] is on [address]  [place]   Ph# [phone]   The number to contact patient is [phone]

## 2021-03-17 NOTE — Telephone Encounter (Signed)
Patient is also requesting a call back from Dr. Roda Shutters regarding scheduling surgery for his knee. Please advise

## 2021-04-13 DIAGNOSIS — Z0189 Encounter for other specified special examinations: Secondary | ICD-10-CM

## 2021-04-13 LAB — GLUCOSE, POCT (MANUAL RESULT ENTRY): POC Glucose: 75 mg/dl (ref 70–99)
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
## 2021-05-04 ENCOUNTER — Other Ambulatory Visit: Payer: Self-pay

## 2021-05-04 DIAGNOSIS — Z008 Encounter for other general examination: Secondary | ICD-10-CM

## 2021-05-04 LAB — GLUCOSE, POCT (MANUAL RESULT ENTRY): POC Glucose: 121 mg/dl — AB (ref 70–99)
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
## 2021-05-11 LAB — GLUCOSE, POCT (MANUAL RESULT ENTRY): POC Glucose: 146 mg/dl — AB (ref 70–99)
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
## 2021-05-13 ENCOUNTER — Telehealth: Payer: Self-pay | Admitting: Orthopaedic Surgery

## 2021-05-13 ENCOUNTER — Other Ambulatory Visit: Payer: Self-pay | Admitting: Physician Assistant

## 2021-05-13 MED ORDER — TRAMADOL HCL 50 MG PO TABS: 50.0000 mg | ORAL_TABLET | Freq: Two times a day (BID) | ORAL | 2 refills | Status: DC | PRN

## 2021-05-13 NOTE — Telephone Encounter (Signed)
Sent in

## 2021-05-13 NOTE — Telephone Encounter (Signed)
Patient called. He would like a refill on Tramadol. His call back number is [phone] ?

## 2021-05-18 DIAGNOSIS — Z0189 Encounter for other specified special examinations: Secondary | ICD-10-CM

## 2021-05-18 LAB — GLUCOSE, POCT (MANUAL RESULT ENTRY): POC Glucose: 121 mg/dl — AB (ref 70–99)
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
## 2021-05-25 DIAGNOSIS — Z0189 Encounter for other specified special examinations: Secondary | ICD-10-CM

## 2021-05-25 LAB — GLUCOSE, POCT (MANUAL RESULT ENTRY): POC Glucose: 101 mg/dl — AB (ref 70–99)
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
## 2021-06-01 DIAGNOSIS — Z008 Encounter for other general examination: Secondary | ICD-10-CM

## 2021-06-01 LAB — GLUCOSE, POCT (MANUAL RESULT ENTRY): POC Glucose: 101 mg/dl — AB (ref 70–99)
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
## 2021-06-08 DIAGNOSIS — Z0189 Encounter for other specified special examinations: Secondary | ICD-10-CM

## 2021-06-08 LAB — GLUCOSE, POCT (MANUAL RESULT ENTRY): POC Glucose: 110 mg/dl — AB (ref 70–99)
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
## 2021-06-15 DIAGNOSIS — Z0189 Encounter for other specified special examinations: Secondary | ICD-10-CM

## 2021-06-15 LAB — GLUCOSE, POCT (MANUAL RESULT ENTRY): POC Glucose: 90 mg/dl (ref 70–99)
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
## 2021-07-12 ENCOUNTER — Other Ambulatory Visit: Payer: Self-pay | Admitting: Physician Assistant

## 2021-07-12 ENCOUNTER — Telehealth: Payer: Self-pay | Admitting: Orthopaedic Surgery

## 2021-07-12 MED ORDER — TRAMADOL HCL 50 MG PO TABS: 50.0000 mg | ORAL_TABLET | Freq: Two times a day (BID) | ORAL | 2 refills | Status: DC | PRN

## 2021-07-12 NOTE — Telephone Encounter (Signed)
Corrected and sent in

## 2021-07-12 NOTE — Telephone Encounter (Signed)
Sent in

## 2021-07-12 NOTE — Telephone Encounter (Signed)
Patient called needing Rx refilled (Tramadol) Patient said he uses [facility] in [place] on [address]. The number to contact patient is [phone]

## 2021-07-20 DIAGNOSIS — Z008 Encounter for other general examination: Secondary | ICD-10-CM

## 2021-07-20 LAB — GLUCOSE, POCT (MANUAL RESULT ENTRY): POC Glucose: 105 mg/dl — AB (ref 70–99)
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
## 2021-07-21 ENCOUNTER — Ambulatory Visit (INDEPENDENT_AMBULATORY_CARE_PROVIDER_SITE_OTHER): Payer: Medicare Other

## 2021-07-21 ENCOUNTER — Encounter: Payer: Self-pay | Admitting: Orthopaedic Surgery

## 2021-07-21 ENCOUNTER — Ambulatory Visit (INDEPENDENT_AMBULATORY_CARE_PROVIDER_SITE_OTHER): Payer: Medicare Other | Admitting: Orthopaedic Surgery

## 2021-07-21 DIAGNOSIS — Z96651 Presence of right artificial knee joint: Secondary | ICD-10-CM
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
## 2021-07-29 ENCOUNTER — Other Ambulatory Visit: Payer: Self-pay | Admitting: Infectious Diseases

## 2021-07-29 ENCOUNTER — Ambulatory Visit
Admission: RE | Admit: 2021-07-29 | Discharge: 2021-07-29 | Disposition: A | Discharge status: Home / Self Care | Payer: Medicare Other | Source: Ambulatory Visit | Attending: Infectious Diseases | Admitting: Infectious Diseases

## 2021-07-29 DIAGNOSIS — R079 Chest pain, unspecified: Secondary | ICD-10-CM

## 2021-08-03 DIAGNOSIS — Z0189 Encounter for other specified special examinations: Secondary | ICD-10-CM

## 2021-08-03 LAB — GLUCOSE, POCT (MANUAL RESULT ENTRY): POC Glucose: 111 mg/dl — AB (ref 70–99)
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
## 2021-08-10 DIAGNOSIS — Z0189 Encounter for other specified special examinations: Secondary | ICD-10-CM

## 2021-08-10 LAB — GLUCOSE, POCT (MANUAL RESULT ENTRY): POC Glucose: 117 mg/dl — AB (ref 70–99)
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
## 2021-09-02 ENCOUNTER — Other Ambulatory Visit: Payer: Self-pay | Admitting: Internal Medicine

## 2021-09-02 DIAGNOSIS — R109 Unspecified abdominal pain: Secondary | ICD-10-CM

## 2021-09-02 DIAGNOSIS — R17 Unspecified jaundice: Secondary | ICD-10-CM

## 2021-09-06 ENCOUNTER — Other Ambulatory Visit: Payer: Medicare Other

## 2021-09-08 ENCOUNTER — Ambulatory Visit
Admission: RE | Admit: 2021-09-08 | Discharge: 2021-09-08 | Disposition: A | Discharge status: Home / Self Care | Payer: Medicare Other | Source: Ambulatory Visit | Attending: Internal Medicine | Admitting: Internal Medicine

## 2021-09-08 DIAGNOSIS — R109 Unspecified abdominal pain: Secondary | ICD-10-CM

## 2021-09-08 DIAGNOSIS — R17 Unspecified jaundice: Secondary | ICD-10-CM

## 2021-09-14 DIAGNOSIS — Z0189 Encounter for other specified special examinations: Secondary | ICD-10-CM

## 2021-09-14 LAB — GLUCOSE, POCT (MANUAL RESULT ENTRY): POC Glucose: 108 mg/dl — AB (ref 70–99)
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
## 2021-09-15 ENCOUNTER — Other Ambulatory Visit: Payer: Self-pay | Admitting: Internal Medicine

## 2021-09-20 ENCOUNTER — Other Ambulatory Visit: Payer: Self-pay | Admitting: Internal Medicine

## 2021-09-20 DIAGNOSIS — R17 Unspecified jaundice: Secondary | ICD-10-CM

## 2021-09-20 DIAGNOSIS — R109 Unspecified abdominal pain: Secondary | ICD-10-CM

## 2021-10-06 ENCOUNTER — Telehealth: Payer: Self-pay | Admitting: Physician Assistant

## 2021-10-06 ENCOUNTER — Other Ambulatory Visit: Payer: Self-pay | Admitting: Physician Assistant

## 2021-10-06 MED ORDER — TRAMADOL HCL 50 MG PO TABS: 50.0000 mg | ORAL_TABLET | Freq: Two times a day (BID) | ORAL | 2 refills | Status: AC | PRN

## 2021-10-06 NOTE — Telephone Encounter (Signed)
Patient called in stating he needs a refill on traMADol (ULTRAM) 50 MG tablet he has 1 left

## 2021-10-06 NOTE — Telephone Encounter (Signed)
sent 
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
## 2021-11-11 ENCOUNTER — Telehealth: Payer: Self-pay | Admitting: Orthopaedic Surgery

## 2021-11-11 NOTE — Telephone Encounter (Signed)
Refill on tramadol
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
## 2022-01-10 ENCOUNTER — Telehealth: Payer: Self-pay | Admitting: Orthopaedic Surgery

## 2022-01-10 ENCOUNTER — Other Ambulatory Visit: Payer: Self-pay | Admitting: Physician Assistant

## 2022-01-10 MED ORDER — TRAMADOL HCL 50 MG PO TABS: 50.0000 mg | ORAL_TABLET | Freq: Three times a day (TID) | ORAL | 0 refills | Status: AC | PRN

## 2022-01-10 NOTE — Telephone Encounter (Signed)
Pt called requesting a refill of tramadol. Please send to [facility] in [place]. Pt phone number is [phone].

## 2022-01-10 NOTE — Telephone Encounter (Signed)
sent 
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
## 2022-03-15 DIAGNOSIS — Z0189 Encounter for other specified special examinations: Secondary | ICD-10-CM

## 2022-03-15 LAB — GLUCOSE, POCT (MANUAL RESULT ENTRY): POC Glucose: 102 mg/dl — AB (ref 70–99)
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
## 2022-03-23 ENCOUNTER — Encounter: Payer: Self-pay | Admitting: Radiology

## 2022-04-12 IMAGING — DX DG KNEE 1-2V PORT*R*
2 series · 2 of 2 positions shown · non-contrast
Comparison: April 16, 2020.

CLINICAL DATA: Status post right knee replacement.

EXAM:
PORTABLE RIGHT KNEE - 1-2 VIEW

[knee ap]
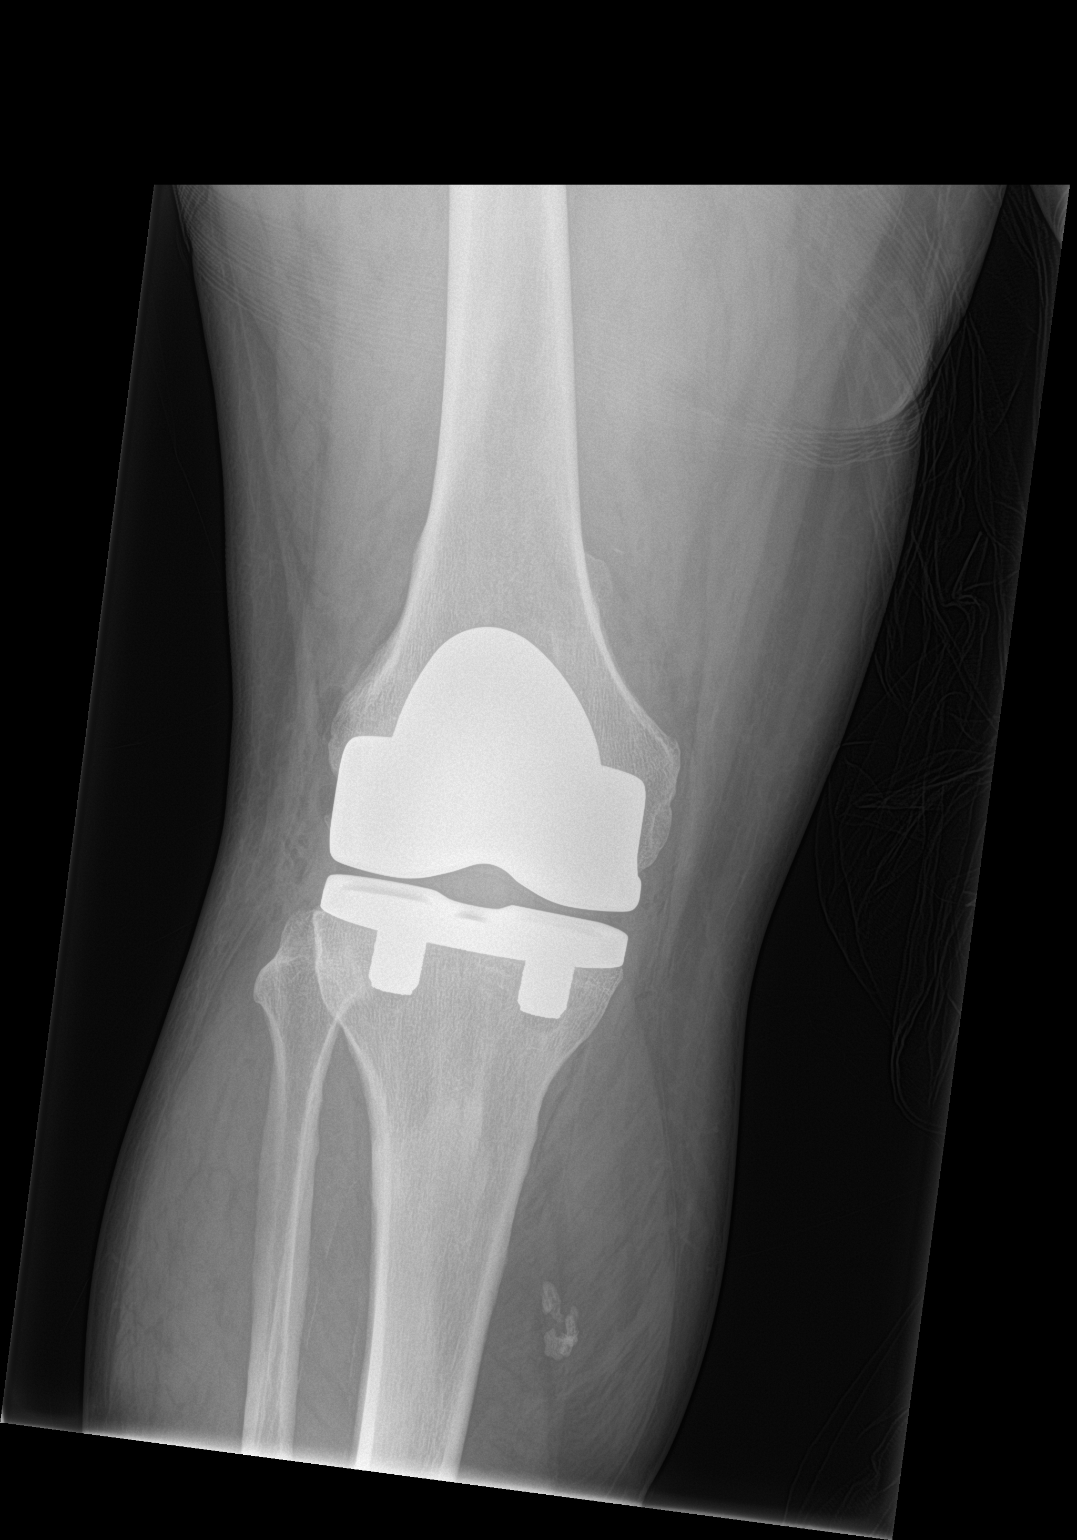

[knee lat]
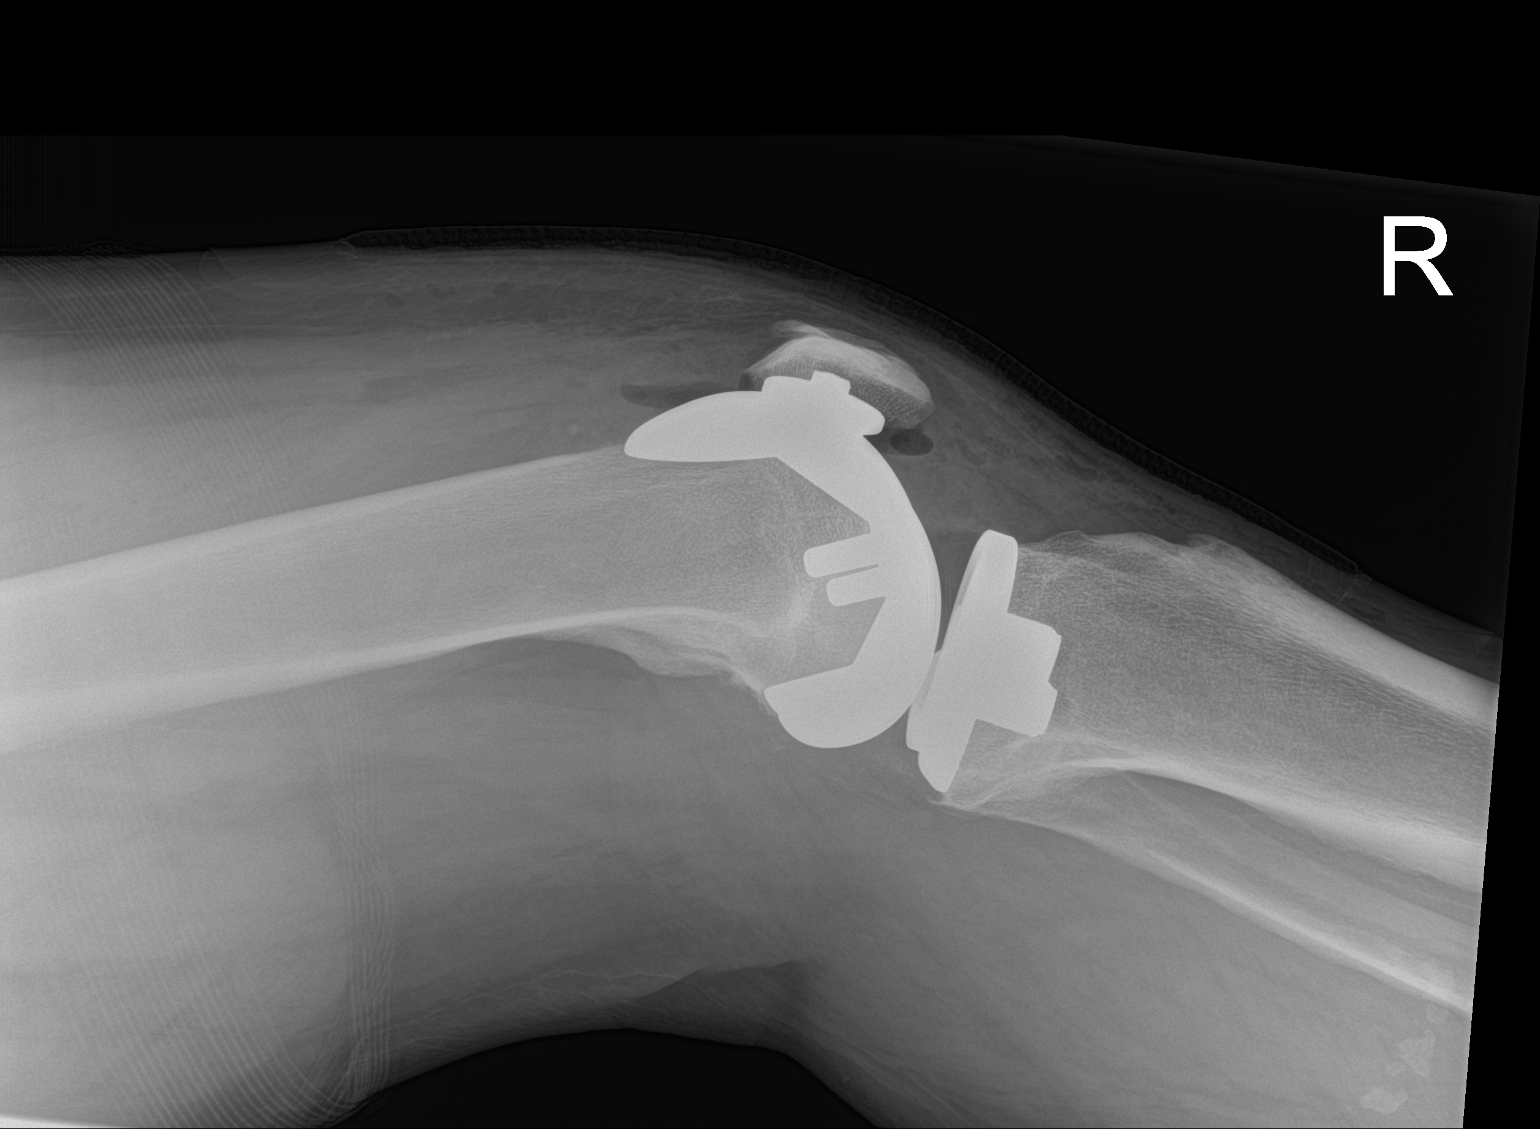

[2 of 2 positions shown; findings below may reference images not displayed]

FINDINGS: The right femoral and tibial components are well situated. Expected
postoperative changes are noted in the soft tissues anteriorly.
IMPRESSION: Status post right total knee arthroplasty.

## 2022-07-21 ENCOUNTER — Ambulatory Visit (INDEPENDENT_AMBULATORY_CARE_PROVIDER_SITE_OTHER): Payer: 59 | Admitting: Physician Assistant

## 2022-07-21 ENCOUNTER — Other Ambulatory Visit (INDEPENDENT_AMBULATORY_CARE_PROVIDER_SITE_OTHER): Payer: 59

## 2022-07-21 ENCOUNTER — Encounter: Payer: Self-pay | Admitting: Physician Assistant

## 2022-07-21 VITALS — Ht 69.0 in | Wt 285.0 lb

## 2022-07-21 DIAGNOSIS — M1712 Unilateral primary osteoarthritis, left knee: Secondary | ICD-10-CM

## 2022-07-21 DIAGNOSIS — Z96651 Presence of right artificial knee joint: Secondary | ICD-10-CM
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
## 2022-09-11 ENCOUNTER — Ambulatory Visit
Admission: EM | Admit: 2022-09-11 | Discharge: 2022-09-11 | Disposition: A | Discharge status: Home / Self Care | Payer: 59 | Attending: Emergency Medicine | Admitting: Emergency Medicine

## 2022-09-11 DIAGNOSIS — Z20822 Contact with and (suspected) exposure to covid-19: Secondary | ICD-10-CM

## 2022-09-11 DIAGNOSIS — J069 Acute upper respiratory infection, unspecified: Secondary | ICD-10-CM | POA: Diagnosis not present

## 2022-09-11 MED ORDER — BENZONATATE 100 MG PO CAPS: 100.0000 mg | ORAL_CAPSULE | Freq: Three times a day (TID) | ORAL | 0 refills | Status: AC | PRN

## 2022-09-11 NOTE — Discharge Instructions (Addendum)
Take the [facility] as directed for cough.    Your COVID test is pending.    Take Tylenol as needed for fever or discomfort.  Rest and keep yourself hydrated.    Follow-up with your primary care provider if your symptoms are not improving.
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
## 2022-09-11 NOTE — ED Triage Notes (Signed)
Patient to Urgent Care with complaints of a Covid exposure. Reports cough/ nasal congestion.  Home Covid test negative. Symptoms started two days ago.  Using Flonase.

## 2022-09-12 ENCOUNTER — Telehealth: Payer: Self-pay | Admitting: Emergency Medicine

## 2022-09-12 LAB — SARS CORONAVIRUS 2 (TAT 6-24 HRS): SARS Coronavirus 2: POSITIVE — AB

## 2022-09-12 MED ORDER — MOLNUPIRAVIR EUA 200MG CAPSULE: 4.0000 | ORAL_CAPSULE | Freq: Two times a day (BID) | ORAL | 0 refills | Status: AC

## 2022-09-12 NOTE — Telephone Encounter (Signed)
COVID positive.  Treating with molnupiravir.  Discussed that this is an emergency authorized medication for treatment of COVID.  Discussed side effects including nausea, diarrhea, dizziness.  Discussed other symptomatic treatment including Tylenol, rest, hydration.  Instructed patient to follow-up with PCP if symptoms are not improving.  ED precautions discussed.  Patient agrees to plan of care.
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
## 2022-11-27 ENCOUNTER — Ambulatory Visit: Payer: 59
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
## 2023-01-12 ENCOUNTER — Ambulatory Visit (INDEPENDENT_AMBULATORY_CARE_PROVIDER_SITE_OTHER): Payer: 59 | Admitting: Orthopaedic Surgery

## 2023-01-12 VITALS — Ht 69.0 in | Wt 274.0 lb

## 2023-01-12 DIAGNOSIS — M1712 Unilateral primary osteoarthritis, left knee: Secondary | ICD-10-CM | POA: Diagnosis not present

## 2023-01-12 DIAGNOSIS — Z6841 Body Mass Index (BMI) 40.0 and over, adult: Secondary | ICD-10-CM
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
## 2023-11-26 ENCOUNTER — Encounter: Payer: Self-pay | Admitting: Radiology
